# Patient Record
Sex: Male | Born: 1960
Health system: Southern US, Community
[De-identification: ages and names within clinical notes are randomized; demographics above are authoritative.]

## PROBLEM LIST (undated history)

## (undated) DIAGNOSIS — I428 Other cardiomyopathies: Secondary | ICD-10-CM

## (undated) DIAGNOSIS — R079 Chest pain, unspecified: Secondary | ICD-10-CM

## (undated) DIAGNOSIS — M109 Gout, unspecified: Secondary | ICD-10-CM

## (undated) DIAGNOSIS — I10 Essential (primary) hypertension: Secondary | ICD-10-CM

## (undated) DIAGNOSIS — I2510 Atherosclerotic heart disease of native coronary artery without angina pectoris: Secondary | ICD-10-CM

## (undated) HISTORY — PX: STOMACH SURGERY: SHX791

## (undated) HISTORY — DX: Chest pain, unspecified: R07.9

## (undated) HISTORY — DX: Essential (primary) hypertension: I10

## (undated) HISTORY — DX: Other cardiomyopathies: I42.8

## (undated) HISTORY — DX: Gout, unspecified: M10.9

---

## 1998-12-28 ENCOUNTER — Emergency Department (HOSPITAL_COMMUNITY): Admission: EM | Admit: 1998-12-28 | Discharge: 1998-12-28 | Payer: Self-pay | Admitting: *Deleted

## 2000-06-26 ENCOUNTER — Emergency Department (HOSPITAL_COMMUNITY): Admission: EM | Admit: 2000-06-26 | Discharge: 2000-06-26 | Payer: Self-pay

## 2000-07-01 ENCOUNTER — Emergency Department (HOSPITAL_COMMUNITY): Admission: EM | Admit: 2000-07-01 | Discharge: 2000-07-01 | Payer: Self-pay | Admitting: Emergency Medicine

## 2000-10-13 ENCOUNTER — Emergency Department (HOSPITAL_COMMUNITY): Admission: EM | Admit: 2000-10-13 | Discharge: 2000-10-13 | Payer: Self-pay | Admitting: Emergency Medicine

## 2000-12-26 ENCOUNTER — Emergency Department (HOSPITAL_COMMUNITY): Admission: EM | Admit: 2000-12-26 | Discharge: 2000-12-26 | Payer: Self-pay | Admitting: *Deleted

## 2001-08-09 ENCOUNTER — Encounter: Payer: Self-pay | Admitting: Emergency Medicine

## 2001-08-09 ENCOUNTER — Emergency Department (HOSPITAL_COMMUNITY): Admission: EM | Admit: 2001-08-09 | Discharge: 2001-08-09 | Payer: Self-pay | Admitting: Emergency Medicine

## 2002-03-22 ENCOUNTER — Encounter: Payer: Self-pay | Admitting: Nephrology

## 2002-03-22 ENCOUNTER — Encounter: Admission: RE | Admit: 2002-03-22 | Discharge: 2002-03-22 | Payer: Self-pay | Admitting: Nephrology

## 2002-12-14 ENCOUNTER — Emergency Department (HOSPITAL_COMMUNITY): Admission: EM | Admit: 2002-12-14 | Discharge: 2002-12-14 | Payer: Self-pay | Admitting: Emergency Medicine

## 2002-12-14 ENCOUNTER — Encounter: Payer: Self-pay | Admitting: Emergency Medicine

## 2004-10-08 ENCOUNTER — Emergency Department (HOSPITAL_COMMUNITY): Admission: EM | Admit: 2004-10-08 | Discharge: 2004-10-09 | Payer: Self-pay | Admitting: Emergency Medicine

## 2007-10-06 ENCOUNTER — Emergency Department (HOSPITAL_COMMUNITY): Admission: EM | Admit: 2007-10-06 | Discharge: 2007-10-06 | Payer: Self-pay | Admitting: Emergency Medicine

## 2011-03-12 LAB — CBC
Hemoglobin: 16.9
MCHC: 34.6
MCV: 89.6
RBC: 5.46
WBC: 11.6 — ABNORMAL HIGH

## 2011-03-12 LAB — POCT I-STAT, CHEM 8
BUN: 8
Chloride: 106
Creatinine, Ser: 1.1
Potassium: 3.6
Sodium: 142

## 2011-03-12 LAB — DIFFERENTIAL
Basophils Relative: 1
Eosinophils Absolute: 0
Eosinophils Relative: 0
Lymphocytes Relative: 15
Neutro Abs: 9.1 — ABNORMAL HIGH

## 2011-03-12 LAB — URINALYSIS, ROUTINE W REFLEX MICROSCOPIC
Glucose, UA: NEGATIVE
Hgb urine dipstick: NEGATIVE
Protein, ur: NEGATIVE
Specific Gravity, Urine: 1.006
pH: 7.5

## 2012-03-13 ENCOUNTER — Observation Stay (HOSPITAL_COMMUNITY): Payer: Self-pay

## 2012-03-13 ENCOUNTER — Encounter (HOSPITAL_COMMUNITY): Payer: Self-pay

## 2012-03-13 ENCOUNTER — Observation Stay (HOSPITAL_COMMUNITY)
Admission: EM | Admit: 2012-03-13 | Discharge: 2012-03-14 | Disposition: A | Discharge status: Home / Self Care | Payer: Self-pay | Attending: Emergency Medicine | Admitting: Emergency Medicine

## 2012-03-13 ENCOUNTER — Emergency Department (HOSPITAL_COMMUNITY): Payer: Self-pay

## 2012-03-13 DIAGNOSIS — R079 Chest pain, unspecified: Secondary | ICD-10-CM

## 2012-03-13 DIAGNOSIS — I10 Essential (primary) hypertension: Secondary | ICD-10-CM | POA: Insufficient documentation

## 2012-03-13 DIAGNOSIS — R0789 Other chest pain: Principal | ICD-10-CM | POA: Insufficient documentation

## 2012-03-13 LAB — CBC
HCT: 45.1 % (ref 39.0–52.0)
Hemoglobin: 15.6 g/dL (ref 13.0–17.0)
MCH: 30.2 pg (ref 26.0–34.0)
MCV: 87.2 fL (ref 78.0–100.0)
Platelets: 276 10*3/uL (ref 150–400)
RBC: 5.17 MIL/uL (ref 4.22–5.81)
WBC: 6.4 10*3/uL (ref 4.0–10.5)

## 2012-03-13 LAB — POCT I-STAT, CHEM 8
BUN: 13 mg/dL (ref 6–23)
Creatinine, Ser: 1.1 mg/dL (ref 0.50–1.35)
Glucose, Bld: 115 mg/dL — ABNORMAL HIGH (ref 70–99)
Hemoglobin: 16.7 g/dL (ref 13.0–17.0)
Potassium: 3.8 mEq/L (ref 3.5–5.1)

## 2012-03-13 LAB — APTT: aPTT: 30 seconds (ref 24–37)

## 2012-03-13 LAB — POCT I-STAT TROPONIN I
Troponin i, poc: 0 ng/mL (ref 0.00–0.08)
Troponin i, poc: 0 ng/mL (ref 0.00–0.08)

## 2012-03-13 MED ORDER — ASPIRIN 81 MG PO CHEW
324.0000 mg | CHEWABLE_TABLET | Freq: Once | ORAL | Status: AC
  Administered 2012-03-13: 324 mg via ORAL
  Filled 2012-03-13: qty 4

## 2012-03-13 MED ORDER — NITROGLYCERIN 0.4 MG SL SUBL
SUBLINGUAL_TABLET | SUBLINGUAL | Status: AC
  Filled 2012-03-13: qty 25

## 2012-03-13 MED ORDER — METOPROLOL TARTRATE 1 MG/ML IV SOLN
INTRAVENOUS | Status: AC
  Administered 2012-03-13: 5 mg via INTRAVENOUS
  Filled 2012-03-13: qty 15

## 2012-03-13 MED ORDER — METOPROLOL TARTRATE 25 MG PO TABS
50.0000 mg | ORAL_TABLET | Freq: Once | ORAL | Status: AC
  Administered 2012-03-13: 50 mg via ORAL
  Filled 2012-03-13: qty 2

## 2012-03-13 MED ORDER — METOPROLOL TARTRATE 1 MG/ML IV SOLN
5.0000 mg | Freq: Once | INTRAVENOUS | Status: AC
  Administered 2012-03-13: 5 mg via INTRAVENOUS

## 2012-03-13 MED ORDER — NITROGLYCERIN 2 % TD OINT
1.0000 [in_us] | TOPICAL_OINTMENT | Freq: Once | TRANSDERMAL | Status: AC
  Administered 2012-03-13: 1 [in_us] via TOPICAL
  Filled 2012-03-13: qty 1

## 2012-03-13 MED ORDER — METOPROLOL TARTRATE 25 MG PO TABS
50.0000 mg | ORAL_TABLET | Freq: Four times a day (QID) | ORAL | Status: DC
  Administered 2012-03-13 – 2012-03-14 (×3): 50 mg via ORAL
  Filled 2012-03-13: qty 2
  Filled 2012-03-13 (×2): qty 1
  Filled 2012-03-13: qty 2

## 2012-03-13 MED ORDER — METOPROLOL TARTRATE 25 MG PO TABS
100.0000 mg | ORAL_TABLET | Freq: Once | ORAL | Status: AC
  Administered 2012-03-13: 100 mg via ORAL
  Filled 2012-03-13: qty 4

## 2012-03-13 MED ORDER — SODIUM CHLORIDE 0.9 % IV SOLN
1000.0000 mL | INTRAVENOUS | Status: DC
  Administered 2012-03-13: 1000 mL via INTRAVENOUS

## 2012-03-13 NOTE — ED Notes (Signed)
bp down pt up to the br

## 2012-03-13 NOTE — ED Notes (Signed)
repor given to [employer], Charity fundraiser

## 2012-03-13 NOTE — ED Notes (Signed)
Pt waif echecked his BP this am and it was high

## 2012-03-13 NOTE — ED Provider Notes (Signed)
Discussed with the patient with Dr. Antoine Poche.  Recommends remaining on protocol overnight, consider 50 mg lopressor po q 6 hours.  As long as systolic pressure remains above 110, patient may receive up to 400 mg lopressor po.  Patient currently resting comfortably, remains chest pain free.  Does appear to be anxious.  Reports increased stress level due to demands of new job.  Lungs CTA bilaterally, S1/S2, RRR.  Abdomen soft, bowel sounds present.  Strong distal pulses palpated all extremities.  Monitor reveals NSR without ectopy.  Labs unremarkable.  12 lead reviewed, no indication of ischemia.  Negative troponins.  Discussed treatment plan with patient.  Patient has received information from Psychologist, sport and exercise.    Jimmye Norman, NP 03/13/12 2337

## 2012-03-13 NOTE — ED Notes (Signed)
Istat Troponin 0.00

## 2012-03-13 NOTE — ED Provider Notes (Addendum)
History     CSN: [id number] Arrival date & time 03/13/12  0903 First MD Initiated Contact with Patient 03/13/12 0915      Chief Complaint  Patient presents with  . Hypertension    HPI Pt has not seen a doctor for 4 years.  He was having some pain in his chest this am when he woke up.  He has had tightness in his left chest.  His heart was racing and he was short of breath.  He feels like the symptoms have resolved at this point.  Pt denies any fever or cough.  No leg swelling.  Family took his blood pressure today and it was noted to be elevated.  No past medical history on file.  No past surgical history on file.  No family history on file.  History  Substance Use Topics  . Smoking status: Former Games developer  . Smokeless tobacco: Not on file  . Alcohol Use: No      Review of Systems  All other systems reviewed and are negative.    Allergies  Review of patient's allergies indicates no known allergies.  Home Medications   Current Outpatient Rx  Name Route Sig Dispense Refill  . OMEGA-3 FATTY ACIDS 1000 MG PO CAPS Oral Take 1 g by mouth daily.    . ADULT MULTIVITAMIN W/MINERALS CH Oral Take 1 tablet by mouth daily.      BP 186/121  Pulse 103  Temp 98.2 F (36.8 C) (Oral)  Resp 17  SpO2 99%  Physical Exam  Nursing note and vitals reviewed. Constitutional: He appears well-developed and well-nourished. No distress.  HENT:  Head: Normocephalic and atraumatic.  Right Ear: External ear normal.  Left Ear: External ear normal.  Eyes: Conjunctivae normal are normal. Right eye exhibits no discharge. Left eye exhibits no discharge. No scleral icterus.  Neck: Neck supple. No tracheal deviation present.  Cardiovascular: Normal rate, regular rhythm and intact distal pulses.   Pulmonary/Chest: Effort normal and breath sounds normal. No stridor. No respiratory distress. He has no wheezes. He has no rales.  Abdominal: Soft. Bowel sounds are normal. He exhibits no distension.  There is no tenderness. There is no rebound and no guarding.  Musculoskeletal: He exhibits no edema and no tenderness.  Neurological: He is alert. He has normal strength. No sensory deficit. Cranial nerve deficit:  no gross defecits noted. He exhibits normal muscle tone. He displays no seizure activity. Coordination normal.  Skin: Skin is warm and dry. No rash noted.  Psychiatric: He has a normal mood and affect.    ED Course  Procedures (including critical care time) EKG Rate 100 SINUS TACHYCARDIA ~ V-rate> 99 PROBABLE LEFT ATRIAL ABNORMALITY ~ P >46mS, <-0.32mV V100 Nl interval and axis Labs Reviewed  POCT I-STAT, CHEM 8 - Abnormal; Notable for the following:    Glucose, Bld 115 (*)     All other components within normal limits  CBC  PROTIME-INR  APTT  POCT I-STAT TROPONIN I  POCT I-STAT TROPONIN I   Dg Chest Port 1 View  03/13/2012  *RADIOLOGY REPORT*  Clinical Data: Chest pain, hypertension.  PORTABLE CHEST - 1 VIEW  Comparison: None.  Findings: Heart is upper limits normal in size.  Mild peribronchial thickening and interstitial prominence.  No confluent opacities or effusions.  No acute bony abnormality.  IMPRESSION: Suspect mild bronchitic changes.   Original Report Authenticated By: Cyndie Chime, M.D.      1. Chest pain   2. Hypertension  MDM  The patient's i-STAT troponin and i-STAT Chem-8 are unremarkable. The patient has no history of heart disease however he does appear to have undiagnosed hypertension.  The patient has heart score of 4 moderately suspicious history, age, and risk factors.  Will move pt to CDU for further treatment.  Plan on CT for further evaluation.   Celene Kras, MD 03/13/12 1048 5 foot 11.  Weight 238.    BMI 33.2  Celene Kras, MD 03/13/12 1057  4:04 PM Pt was unable to get his chest CT.  Heart rate too fast although down in the 70s.  Pt can be done tomorrow.    Could not get a stress echo this afternoon considering his nitrate  use.   Will consult with cardiology but anticipate we will hold overnight and perform CT in the am.  Pt is comfortable with the plan.  No distress at this time.  Celene Kras, MD 03/13/12 [phone]

## 2012-03-13 NOTE — ED Notes (Signed)
Pt received 5 mg metoprolol IV @ 1515 and second dose (5 mg) at 1520.  Verbal order from Hulan Saas, MD

## 2012-03-13 NOTE — ED Notes (Signed)
i-stat troponin results=.00 

## 2012-03-13 NOTE — ED Notes (Signed)
BMI 33.7 

## 2012-03-13 NOTE — ED Notes (Signed)
i moved the pt to a hospital bed in 2 for comfort.  More relaxed now bp still high

## 2012-03-13 NOTE — ED Notes (Signed)
Na        141 K          3.8 Cl         102 ICa        1.23 TCO2    28  Glu        115 BUN      13 Creat     1.1 Hct        49 Hgb       16.7  Anion Gap 16

## 2012-03-13 NOTE — ED Notes (Signed)
Pt sleeping. 

## 2012-03-13 NOTE — ED Notes (Signed)
Pt very anxious. States that he stopped smoking 4 years ago has gained some wait and wife has been checking his BP lately

## 2012-03-14 ENCOUNTER — Observation Stay (HOSPITAL_COMMUNITY): Payer: Self-pay

## 2012-03-14 MED ORDER — NITROGLYCERIN 0.4 MG SL SUBL
SUBLINGUAL_TABLET | SUBLINGUAL | Status: AC
  Administered 2012-03-14: 11:00:00
  Filled 2012-03-14: qty 25

## 2012-03-14 MED ORDER — IOHEXOL 350 MG/ML SOLN: 80.0000 mL | Freq: Once | INTRAVENOUS | Status: DC | PRN

## 2012-03-14 MED ORDER — LORAZEPAM 2 MG/ML IJ SOLN
0.5000 mg | Freq: Once | INTRAMUSCULAR | Status: AC
  Administered 2012-03-14: 0.5 mg via INTRAVENOUS
  Filled 2012-03-14: qty 1

## 2012-03-14 MED ORDER — METOPROLOL TARTRATE 1 MG/ML IV SOLN
INTRAVENOUS | Status: AC
  Administered 2012-03-14: 11:00:00
  Filled 2012-03-14: qty 15

## 2012-03-14 NOTE — ED Provider Notes (Signed)
9:33 AM Patient is in CDU under observation, chest pain protocol.  This is a shared visit with Dr Roselyn Bering.  Patient reports he has been chest pain free since his arrival to ED.  No acute events overnight. Attempt was made to do coronary CT yesterday but patient's heart rate remained too high.  Dr Antoine Poche was consulted and plan made to continued beta blockers and attempt CT this morning.  Patient is A&Ox4, NAD, RRR, no m/r/g, chest nontender, lungs CTAB, abd soft, NT, extremities without edema, distal pulses intact. Patient's HR currently ~70. Plan for Coronary CT.    Received phone call from Dr Llana Aliment - pt does have nonobstructive coronary artery disease, calcium score is 55, 91st percentile for age and gender.  Given patient's high scores, I have paged [facility] to discuss results.    1:19 PM I spoke with Tereso Newcomer, [facility] cardiology regarding patient's results.  He also spoke with his attending and have reviewed results. Plan is for outpatient follow up with cardiology.  I have discussed this and all results with patient who verbalizes understanding and agrees with plan.  Pt has been given resources for PCP follow up - strongly advised to follow closely with PCP and have blood pressure rechecked within 1 week, also recommended checking and charting blood pressure prior to visit (pt has home cuff).  Pt advised of the risks of sustained hypertension.   Pt d/c home with PCP and cardiology f/u.   Discussed all results with patient.  Pt given return precautions.  Pt verbalizes understanding and agrees with plan.     Results for orders placed during the hospital encounter of 03/13/12  CBC      Component Value Range   WBC 6.4  4.0 - 10.5 K/uL   RBC 5.17  4.22 - 5.81 MIL/uL   Hemoglobin 15.6  13.0 - 17.0 g/dL   HCT 44.0  10.2 - 72.5 %   MCV 87.2  78.0 - 100.0 fL   MCH 30.2  26.0 - 34.0 pg   MCHC 34.6  30.0 - 36.0 g/dL   RDW 36.6  44.0 - 34.7 %   Platelets 276  150 - 400 K/uL    PROTIME-INR      Component Value Range   Prothrombin Time 12.9  11.6 - 15.2 seconds   INR 0.98  0.00 - 1.49  APTT      Component Value Range   aPTT 30  24 - 37 seconds  POCT I-STAT TROPONIN I      Component Value Range   Troponin i, poc 0.00  0.00 - 0.08 ng/mL   Comment 3           POCT I-STAT, CHEM 8      Component Value Range   Sodium 141  135 - 145 mEq/L   Potassium 3.8  3.5 - 5.1 mEq/L   Chloride 102  96 - 112 mEq/L   BUN 13  6 - 23 mg/dL   Creatinine, Ser 4.25  0.50 - 1.35 mg/dL   Glucose, Bld 956 (*) 70 - 99 mg/dL   Calcium, Ion 3.87  5.64 - 1.23 mmol/L   TCO2 28  0 - 100 mmol/L   Hemoglobin 16.7  13.0 - 17.0 g/dL   HCT 33.2  95.1 - 88.4 %  POCT I-STAT TROPONIN I      Component Value Range   Troponin i, poc 0.00  0.00 - 0.08 ng/mL   Comment 3  POCT I-STAT TROPONIN I      Component Value Range   Troponin i, poc 0.00  0.00 - 0.08 ng/mL   Comment 3            Ct Heart Morp W/cta Cor W/score W/ca W/cm &/or Wo/cm  03/14/2012  *RADIOLOGY REPORT*  INDICATION:  51 year old male presenting with chest pain.  CT ANGIOGRAPHY OF THE HEART, CORONARY ARTERY, STRUCTURE, AND MORPHOLOGY  CONTRAST:  80 ml of Omnipaque 350.  COMPARISON:  No priors.  TECHNIQUE:  CT angiography of the coronary vessels was performed on a 256 channel system using prospective ECG gating.  A scout and noncontrast exam (for calcium scoring) were performed.  Circulation time was measured using a test bolus.  Coronary CTA was performed with sub mm slice collimation during portions of the cardiac cycle after prior injection of iodinated contrast.  Imaging post processing was performed on an independent workstation creating multiplanar and 3-D images, and quantitative analysis of the heart and coronary arteries.  Note that this exam targets the heart and the chest was not imaged in its entirety.  PREMEDICATION: Lopressor 150 mg, P.O. Lopressor 5 mg, IV Nitroglycerin 400 mcg, sublingual.  FINDINGS: Technical  quality:  Excellent  Heart rate:  60  CORONARY ARTERIES: Left main coronary artery:  Negative. Left anterior descending:  Eccentric calcified plaque at the ostium, without any associated luminal stenosis. Otherwise, negative. Ramus intermedius:  Negative. Left circumflex:  Negative. Right coronary artery:  Negative. Posterior descending artery:  Negative. Dominance:  Codominant.  CORONARY CALCIUM: Total Agatston Score:  55 MESA database percentile:  91st  AORTA AND PULMONARY MEASUREMENTS: Aortic root (21 - 40 mm):             25 mm  at the annulus             39 mm  at the sinuses of Valsalva             31 mm  at the sinotubular junction Ascending aorta ( <  40 mm):  35 mm Descending aorta ( <  40 mm):  26 mm Main pulmonary artery:  ( <  30 mm):  27 mm  EXTRACARDIAC FINDINGS: Mild diffuse bronchial wall thickening.  Within the visualized portions of the thorax and there is no acute consolidative airspace disease, no pleural effusions and no definite suspicious-appearing pulmonary nodules or masses.  No pericardial fluid or thickening. Visualized portions of the upper abdomen are unremarkable.  IMPRESSION:  1.  Mild nonobstructive coronary artery disease.  The patient's total coronary artery calcium score is 55, which is 91st percentile for patient's matched age and gender.   Assessment for potential risk factor modification, dietary therapy or pharmacologic therapy may be warranted, if clinically indicated. 2.  Mild diffuse bronchial wall thickening.  This is nonspecific, and could suggest very mild bronchitis or reactive airway disease. No signs of bronchopneumonia within the visualized thorax are identified at this time. 3.  Codominance of the coronary arteries.  Report was called to CDU mid level at [phone] at 12 noon on 03/14/2012.   Original Report Authenticated By: Florencia Reasons, M.D.    Dg Chest Port 1 View  03/13/2012  *RADIOLOGY REPORT*  Clinical Data: Chest pain, hypertension.  PORTABLE CHEST - 1  VIEW  Comparison: None.  Findings: Heart is upper limits normal in size.  Mild peribronchial thickening and interstitial prominence.  No confluent opacities or effusions.  No acute bony abnormality.  IMPRESSION: Suspect mild bronchitic  changes.   Original Report Authenticated By: Cyndie Chime, M.D.       [place], [place] 03/14/12 [phone]

## 2012-03-14 NOTE — ED Notes (Signed)
Patient states he is unable to sleep. Offered to speak to physician concerning medication for sleep. Will continue to monitor patient

## 2012-03-14 NOTE — ED Notes (Signed)
Spoke with CT scan Avril, She spoke with Dr. Reche Dixon. Order received from Dr. Reche Dixon to five 50 mg PO Lopressor and to reevaluate in 1 hour.

## 2012-03-14 NOTE — ED Provider Notes (Signed)
Medical screening examination/treatment/procedure(s) were performed by non-physician practitioner and as supervising physician I was immediately available for consultation/collaboration.  Krithi Bray R. Yasmin Dibello, MD 03/14/12 2254 

## 2012-03-16 NOTE — ED Provider Notes (Signed)
Medical screening examination/treatment/procedure(s) were conducted as a shared visit with non-physician practitioner(s) and myself.  I personally evaluated the patient during the encounter   Celene Kras, MD 03/16/12 [phone]

## 2012-03-30 ENCOUNTER — Encounter: Payer: Self-pay | Admitting: *Deleted

## 2012-03-30 ENCOUNTER — Encounter: Payer: Self-pay | Admitting: Cardiovascular Disease

## 2012-03-30 DIAGNOSIS — R079 Chest pain, unspecified: Secondary | ICD-10-CM | POA: Insufficient documentation

## 2012-03-30 DIAGNOSIS — I10 Essential (primary) hypertension: Secondary | ICD-10-CM | POA: Insufficient documentation

## 2012-03-31 ENCOUNTER — Ambulatory Visit (INDEPENDENT_AMBULATORY_CARE_PROVIDER_SITE_OTHER): Payer: Self-pay | Admitting: Cardiovascular Disease

## 2012-03-31 ENCOUNTER — Encounter: Payer: Self-pay | Admitting: Cardiovascular Disease

## 2012-03-31 VITALS — BP 130/82 | HR 82 | Ht 71.0 in | Wt 239.0 lb

## 2012-03-31 DIAGNOSIS — I10 Essential (primary) hypertension: Secondary | ICD-10-CM

## 2012-03-31 DIAGNOSIS — R079 Chest pain, unspecified: Secondary | ICD-10-CM

## 2012-03-31 DIAGNOSIS — Z8639 Personal history of other endocrine, nutritional and metabolic disease: Secondary | ICD-10-CM

## 2012-03-31 DIAGNOSIS — Z862 Personal history of diseases of the blood and blood-forming organs and certain disorders involving the immune mechanism: Secondary | ICD-10-CM

## 2012-03-31 MED ORDER — LOSARTAN POTASSIUM 25 MG PO TABS: 25.0000 mg | ORAL_TABLET | Freq: Every day | ORAL | Status: DC

## 2012-03-31 NOTE — Assessment & Plan Note (Signed)
Nonrecurrent since ER visit  CT with nonobstructive disease.  Discussed weight loss, ASA, Rx BP.  Given calcium score under 100 no need for functional study

## 2012-03-31 NOTE — Assessment & Plan Note (Signed)
Start Cozaar and low sodium diet.  F/U 6 months

## 2012-03-31 NOTE — Patient Instructions (Addendum)
Your physician wants you to follow-up in:   6 MONTHS WITH DR Haywood Filler will receive a reminder letter in the mail two months in advance. If you don't receive a letter, please call our office to schedule the follow-up appointment. Your physician has recommended you make the following change in your medication: START LOSARTAN  25 MG EVERY DAY  Your physician recommends that you return for lab work in: FASTING THIS WEEK   BMET LIPID LIVER

## 2012-03-31 NOTE — Progress Notes (Signed)
Patient ID: Todd Meyer, male   DOB: 01/11/1961, 51 y.o.   MRN: [id number] 51 yo seen in [facility] ED 9/27 for chest pain.  R/O  Had some issues with getting HR low enough to do cardiac CT.  Reviewed scan on [employer] work station  IMPRESSION:  1. Mild nonobstructive coronary artery disease. The patient's total coronary artery calcium score is 55, which is 91st percentile for patient's matched age and gender. Assessment for potential risk factor modification, dietary therapy or pharmacologic therapy may be warranted, if clinically indicated. 2. Mild diffuse bronchial wall thickening. This is nonspecific, and could suggest very mild bronchitis or reactive airway disease. No signs of bronchopneumonia within the visualized thorax are identified at this time. 3. Codominance of the coronary arteries.  Essentially nonobstructive disease with high calcium score for age and sex mathed controls.  Reviewed home BP readings and somewhat high.  Not taking ASA Prevoiusly seen by Dr Audrie Lia but not now Needs fasting lipids.  Works driving a Presenter, broadcasting. No aerobic activity  Too much salt in diet and gained a lot of weight this year  ROS: Denies fever, malais, weight loss, blurry vision, decreased visual acuity, cough, sputum, SOB, hemoptysis, pleuritic pain, palpitaitons, heartburn, abdominal pain, melena, lower extremity edema, claudication, or rash.  All other systems reviewed and negative   General: Affect appropriate Healthy:  appears stated age HEENT: normal Neck supple with no adenopathy JVP normal no bruits no thyromegaly Lungs clear with no wheezing and good diaphragmatic motion Heart:  S1/S2 no murmur,rub, gallop or click PMI normal Abdomen: benighn, BS positve, no tenderness, no AAA no bruit.  No HSM or HJR Distal pulses intact with no bruits No edema Neuro non-focal Skin warm and dry No muscular weakness  Medications Current Outpatient Prescriptions  Medication Sig Dispense Refill  .  fish oil-omega-3 fatty acids 1000 MG capsule Take 1 g by mouth daily.      . Multiple Vitamin (MULTIVITAMIN WITH MINERALS) TABS Take 1 tablet by mouth daily.      Marland Kitchen losartan (COZAAR) 25 MG tablet Take 1 tablet (25 mg total) by mouth daily.  90 tablet  3    Allergies Review of patient's allergies indicates no known allergies.  Family History: Family History  Problem Relation Age of Onset  . Adopted: Yes    Social History: History   Social History  . Marital Status: Married    Spouse Name: N/A    Number of Children: N/A  . Years of Education: N/A   Occupational History  . Not on file.   Social History Main Topics  . Smoking status: Former Games developer  . Smokeless tobacco: Not on file  . Alcohol Use: No  . Drug Use: No  . Sexually Active:    Other Topics Concern  . Not on file   Social History Narrative  . No narrative on file    Electrocardiogram:  9/27  ST rate 100 otherise normal ECG  Assessment and Plan

## 2012-03-31 NOTE — Assessment & Plan Note (Signed)
No recent lipids in chart Given calcium score of 55 91st percentile for age will check fasting lipids with low threshold to start statin

## 2012-04-08 ENCOUNTER — Other Ambulatory Visit (INDEPENDENT_AMBULATORY_CARE_PROVIDER_SITE_OTHER): Payer: Self-pay

## 2012-04-08 DIAGNOSIS — R079 Chest pain, unspecified: Secondary | ICD-10-CM

## 2012-04-08 LAB — LIPID PANEL
HDL: 34.1 mg/dL — ABNORMAL LOW (ref >39.00)
Total CHOL/HDL Ratio: 6
VLDL: 25.4 mg/dL (ref 0.0–40.0)

## 2012-04-08 LAB — BASIC METABOLIC PANEL
BUN: 14 mg/dL (ref 6–23)
Chloride: 104 mEq/L (ref 96–112)
GFR: 105.04 mL/min (ref >60.00)
Potassium: 4.2 mEq/L (ref 3.5–5.1)
Sodium: 138 mEq/L (ref 135–145)

## 2012-04-08 LAB — HEPATIC FUNCTION PANEL
Alkaline Phosphatase: 67 U/L (ref 39–117)
Bilirubin, Direct: 0.1 mg/dL (ref 0.0–0.3)
Total Bilirubin: 0.7 mg/dL (ref 0.3–1.2)
Total Protein: 7.5 g/dL (ref 6.0–8.3)

## 2012-04-21 ENCOUNTER — Telehealth: Payer: Self-pay | Admitting: *Deleted

## 2012-04-21 DIAGNOSIS — Z79899 Other long term (current) drug therapy: Secondary | ICD-10-CM

## 2012-04-21 DIAGNOSIS — E785 Hyperlipidemia, unspecified: Secondary | ICD-10-CM

## 2012-04-21 MED ORDER — ATORVASTATIN CALCIUM 20 MG PO TABS: 20.0000 mg | ORAL_TABLET | Freq: Every day | ORAL | Status: DC

## 2012-04-21 NOTE — Telephone Encounter (Signed)
PT  AWARE OF LAB RESULTS WILL  TRY THE  LIPITOR  AND HAVE REPEAT  LABS IN 8 WEEKS  WANTS TO  SEE HOW HE  DOES WITH MEDS BEFORE  SEEING DIETICIAN

## 2012-06-22 ENCOUNTER — Other Ambulatory Visit: Payer: Self-pay

## 2012-11-06 ENCOUNTER — Ambulatory Visit (INDEPENDENT_AMBULATORY_CARE_PROVIDER_SITE_OTHER): Payer: Self-pay | Admitting: Cardiovascular Disease

## 2012-11-06 ENCOUNTER — Encounter: Payer: Self-pay | Admitting: Cardiovascular Disease

## 2012-11-06 VITALS — BP 142/88 | HR 90 | Ht 71.0 in | Wt 240.1 lb

## 2012-11-06 DIAGNOSIS — I10 Essential (primary) hypertension: Secondary | ICD-10-CM

## 2012-11-06 MED ORDER — LOSARTAN POTASSIUM 50 MG PO TABS: 50.0000 mg | ORAL_TABLET | Freq: Every day | ORAL | Status: DC

## 2012-11-06 NOTE — Assessment & Plan Note (Signed)
Resolved normal cardiac CT

## 2012-11-06 NOTE — Progress Notes (Signed)
Patient ID: SHIQUAN MATHIEU, male   DOB: July 20, 1960, 52 y.o.   MRN: [id number] 52 yo seen in [facility] ED 03/13/12 for chest pain. R/O Had some issues with getting HR low enough to do cardiac CT. Reviewed scan on [employer] work station  IMPRESSION:  1. Mild nonobstructive coronary artery disease. The patient's total coronary artery calcium score is 55, which is 91st percentile for patient's matched age and gender. Assessment for potential risk factor modification, dietary therapy or pharmacologic therapy may be warranted, if clinically indicated. 2. Mild diffuse bronchial wall thickening. This is nonspecific, and could suggest very mild bronchitis or reactive airway disease. No signs of bronchopneumonia within the visualized thorax are identified at this time. 3. Codominance of the coronary arteries.  Essentially nonobstructive disease with high calcium score for age and sex mathed controls. Reviewed home BP readings and somewhat high. Not taking ASA  Prevoiusly seen by Dr Audrie Lia but not now Needs fasting lipids. 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  Discussed low carb diet  ROS: Denies fever, malais, weight loss, blurry vision, decreased visual acuity, cough, sputum, SOB, hemoptysis, pleuritic pain, palpitaitons, heartburn, abdominal pain, melena, lower extremity edema, claudication, or rash.  All other systems reviewed and negative  General: Affect appropriate Healthy:  appears stated age HEENT: normal Neck supple with no adenopathy JVP normal no bruits no thyromegaly Lungs clear with no wheezing and good diaphragmatic motion Heart:  S1/S2 no murmur, no rub, gallop or click PMI normal Abdomen: benighn, BS positve, no tenderness, no AAA no bruit.  No HSM or HJR Distal pulses intact with no bruits No edema Neuro non-focal Skin warm and dry No muscular weakness   Current  Outpatient Prescriptions  Medication Sig Dispense Refill  . atorvastatin (LIPITOR) 20 MG tablet Take 1 tablet (20 mg total) by mouth daily.  90 tablet  3  . fish oil-omega-3 fatty acids 1000 MG capsule Take 1 g by mouth daily.      Marland Kitchen losartan (COZAAR) 25 MG tablet Take 1 tablet (25 mg total) by mouth daily.  90 tablet  3  . Multiple Vitamin (MULTIVITAMIN WITH MINERALS) TABS Take 1 tablet by mouth daily.       No current facility-administered medications for this visit.    Allergies  Review of patient's allergies indicates no known allergies.  Electrocardiogram:  NSR normal ECG  Assessment and Plan

## 2012-11-06 NOTE — Patient Instructions (Addendum)
Your physician recommends that you schedule a follow-up appointment in: SEE DR [facility]  IN [place]  Your physician has recommended you make the following change in your medication:  INCREASE LOSARTAN  TO 50 MG  EVERY DAY

## 2012-11-06 NOTE — Assessment & Plan Note (Signed)
Increase cozaar to 50 mg Wife to check home readings.  Weight loss F/U August

## 2013-01-29 ENCOUNTER — Encounter: Payer: Self-pay | Admitting: Cardiovascular Disease

## 2013-01-29 ENCOUNTER — Ambulatory Visit (INDEPENDENT_AMBULATORY_CARE_PROVIDER_SITE_OTHER): Payer: BC Managed Care – PPO | Admitting: Cardiovascular Disease

## 2013-01-29 VITALS — BP 128/94 | HR 83 | Wt 233.8 lb

## 2013-01-29 DIAGNOSIS — R079 Chest pain, unspecified: Secondary | ICD-10-CM

## 2013-01-29 DIAGNOSIS — Z0000 Encounter for general adult medical examination without abnormal findings: Secondary | ICD-10-CM

## 2013-01-29 DIAGNOSIS — Z8639 Personal history of other endocrine, nutritional and metabolic disease: Secondary | ICD-10-CM

## 2013-01-29 DIAGNOSIS — Z139 Encounter for screening, unspecified: Secondary | ICD-10-CM

## 2013-01-29 DIAGNOSIS — E785 Hyperlipidemia, unspecified: Secondary | ICD-10-CM

## 2013-01-29 DIAGNOSIS — Z862 Personal history of diseases of the blood and blood-forming organs and certain disorders involving the immune mechanism: Secondary | ICD-10-CM

## 2013-01-29 DIAGNOSIS — I10 Essential (primary) hypertension: Secondary | ICD-10-CM

## 2013-01-29 LAB — CBC WITH DIFFERENTIAL/PLATELET
Basophils Absolute: 0.1 10*3/uL (ref 0.0–0.1)
Eosinophils Absolute: 0.1 10*3/uL (ref 0.0–0.7)
Lymphocytes Relative: 45.8 % (ref 12.0–46.0)
Lymphs Abs: 3.1 10*3/uL (ref 0.7–4.0)
MCHC: 33.6 g/dL (ref 30.0–36.0)
Monocytes Relative: 9.6 % (ref 3.0–12.0)
Neutro Abs: 2.8 10*3/uL (ref 1.4–7.7)
Platelets: 271 10*3/uL (ref 150.0–400.0)
RDW: 14 % (ref 11.5–14.6)

## 2013-01-29 LAB — BASIC METABOLIC PANEL
CO2: 29 mEq/L (ref 19–32)
Calcium: 9.5 mg/dL (ref 8.4–10.5)
Chloride: 103 mEq/L (ref 96–112)
Creatinine, Ser: 1 mg/dL (ref 0.4–1.5)
Sodium: 135 mEq/L (ref 135–145)

## 2013-01-29 LAB — HEPATIC FUNCTION PANEL
ALT: 28 U/L (ref 0–53)
AST: 21 U/L (ref 0–37)
Albumin: 3.9 g/dL (ref 3.5–5.2)
Alkaline Phosphatase: 70 U/L (ref 39–117)
Total Protein: 7.1 g/dL (ref 6.0–8.3)

## 2013-01-29 LAB — LIPID PANEL
Cholesterol: 125 mg/dL (ref 0–200)
HDL: 35 mg/dL — ABNORMAL LOW (ref >39.00)
Total CHOL/HDL Ratio: 4
Triglycerides: 68 mg/dL (ref 0.0–149.0)

## 2013-01-29 LAB — HEMOGLOBIN A1C: Hgb A1c MFr Bld: 6.1 % (ref 4.6–6.5)

## 2013-01-29 NOTE — Assessment & Plan Note (Signed)
Well controlled.  Continue current medications and low sodium Dash type diet.    

## 2013-01-29 NOTE — Assessment & Plan Note (Signed)
Resolved No critical disease on CT  Continue risk factor modification for relatively high calcium score

## 2013-01-29 NOTE — Patient Instructions (Addendum)
Your physician wants you to follow-up in: YEAR WITH  DR Haywood Filler will receive a reminder letter in the mail two months in advance. If you don't receive a letter, please call our office to schedule the follow-up appointment. Your physician recommends that you continue on your current medications as directed. Please refer to the Current Medication list given to you today. Your physician recommends that you return for lab work in: TODAY  BMET  CBC  HGBA1C LIPID LIVER  PSA

## 2013-01-29 NOTE — Progress Notes (Signed)
Patient ID: Todd Meyer, male   DOB: 01-23-1961, 52 y.o.   MRN: [id number] 52 yo seen in [facility] ED 03/13/12 for chest pain. R/O Had some issues with getting HR low enough to do cardiac CT. Reviewed scan on [employer] work station  IMPRESSION:  1. Mild nonobstructive coronary artery disease. The patient's total coronary artery calcium score is 55, which is 91st percentile for patient's matched age and gender. Assessment for potential risk factor modification, dietary therapy or pharmacologic therapy may be warranted, if clinically indicated. 2. Mild diffuse bronchial wall thickening. This is nonspecific, and could suggest very mild bronchitis or reactive airway disease. No signs of bronchopneumonia within the visualized thorax are identified at this time. 3. Codominance of the coronary arteries.  Essentially nonobstructive disease with high calcium score for age and sex mathed controls.  Doing much better with diet and exercise Lost about 10 lbs and working out at [employer]  ROS: Denies fever, malais, weight loss, blurry vision, decreased visual acuity, cough, sputum, SOB, hemoptysis, pleuritic pain, palpitaitons, heartburn, abdominal pain, melena, lower extremity edema, claudication, or rash.  All other systems reviewed and negative  General: Affect appropriate Healthy:  appears stated age HEENT: normal Neck supple with no adenopathy JVP normal no bruits no thyromegaly Lungs clear with no wheezing and good diaphragmatic motion Heart:  S1/S2 no murmur, no rub, gallop or click PMI normal Abdomen: benighn, BS positve, no tenderness, no AAA no bruit.  No HSM or HJR Distal pulses intact with no bruits No edema Neuro non-focal Skin warm and dry No muscular weakness   Current Outpatient Prescriptions  Medication Sig Dispense Refill  . atorvastatin (LIPITOR) 20 MG tablet Take 1 tablet (20 mg total) by mouth daily.  90 tablet  3  . fexofenadine (ALLEGRA) 30 MG tablet Take 30 mg by  mouth as needed.      . fish oil-omega-3 fatty acids 1000 MG capsule Take 1 g by mouth daily.      Marland Kitchen losartan (COZAAR) 50 MG tablet Take 1 tablet (50 mg total) by mouth daily.  90 tablet  3  . Multiple Vitamin (MULTIVITAMIN WITH MINERALS) TABS Take 1 tablet by mouth daily.       No current facility-administered medications for this visit.    Allergies  Review of patient's allergies indicates no known allergies.  Electrocardiogram:  NSR rate 82 normal   Assessment and Plan

## 2013-01-29 NOTE — Assessment & Plan Note (Signed)
Has not established with primary Leretha Dykes yet  Will check chol and other labs

## 2013-04-22 ENCOUNTER — Other Ambulatory Visit: Payer: Self-pay

## 2013-04-27 NOTE — Telephone Encounter (Signed)
PT  HAD  LABS  DONE IN  [place] .Todd Meyer

## 2013-05-21 ENCOUNTER — Other Ambulatory Visit: Payer: Self-pay

## 2013-05-21 MED ORDER — ATORVASTATIN CALCIUM 20 MG PO TABS: 20.0000 mg | ORAL_TABLET | Freq: Every day | ORAL | Status: DC

## 2013-12-13 ENCOUNTER — Other Ambulatory Visit: Payer: Self-pay

## 2013-12-13 MED ORDER — LOSARTAN POTASSIUM 50 MG PO TABS: 50.0000 mg | ORAL_TABLET | Freq: Every day | ORAL | Status: DC

## 2014-05-15 ENCOUNTER — Other Ambulatory Visit: Payer: Self-pay | Admitting: Cardiovascular Disease

## 2014-07-20 ENCOUNTER — Other Ambulatory Visit: Payer: Self-pay | Admitting: Cardiovascular Disease

## 2014-11-17 ENCOUNTER — Other Ambulatory Visit: Payer: Self-pay | Admitting: Cardiovascular Disease

## 2014-11-18 NOTE — Telephone Encounter (Signed)
Per note 8.15.14

## 2014-12-03 ENCOUNTER — Other Ambulatory Visit: Payer: Self-pay | Admitting: Cardiovascular Disease

## 2014-12-05 NOTE — Telephone Encounter (Signed)
Needs lab work lipid and liver before refilling and F/U appt

## 2015-01-12 ENCOUNTER — Other Ambulatory Visit: Payer: Self-pay | Admitting: Cardiovascular Disease

## 2015-01-12 MED ORDER — LOSARTAN POTASSIUM 50 MG PO TABS
50.0000 mg | ORAL_TABLET | Freq: Every day | ORAL | Status: DC
Start: 2015-01-12 — End: 2016-04-26

## 2015-03-10 ENCOUNTER — Other Ambulatory Visit: Payer: Self-pay | Admitting: Cardiology

## 2015-03-10 NOTE — Telephone Encounter (Signed)
Patient has not been seen in over two years and a note to call and schedule has been put on his recent rx's, but he still has failed to do so. Please advise. Thanks, MI

## 2015-12-21 ENCOUNTER — Other Ambulatory Visit: Payer: Self-pay | Admitting: Cardiovascular Disease

## 2016-04-26 ENCOUNTER — Encounter (HOSPITAL_COMMUNITY): Payer: Self-pay | Admitting: Emergency Medicine

## 2016-04-26 ENCOUNTER — Emergency Department (HOSPITAL_COMMUNITY): Payer: BLUE CROSS/BLUE SHIELD

## 2016-04-26 ENCOUNTER — Emergency Department (HOSPITAL_COMMUNITY)
Admission: EM | Admit: 2016-04-26 | Discharge: 2016-04-26 | Disposition: A | Discharge status: Home / Self Care | Payer: BLUE CROSS/BLUE SHIELD | Attending: Emergency Medicine | Admitting: Emergency Medicine

## 2016-04-26 DIAGNOSIS — I2510 Atherosclerotic heart disease of native coronary artery without angina pectoris: Secondary | ICD-10-CM | POA: Insufficient documentation

## 2016-04-26 DIAGNOSIS — Z79899 Other long term (current) drug therapy: Secondary | ICD-10-CM | POA: Diagnosis not present

## 2016-04-26 DIAGNOSIS — M79674 Pain in right toe(s): Secondary | ICD-10-CM

## 2016-04-26 DIAGNOSIS — I10 Essential (primary) hypertension: Secondary | ICD-10-CM | POA: Insufficient documentation

## 2016-04-26 DIAGNOSIS — Z87891 Personal history of nicotine dependence: Secondary | ICD-10-CM | POA: Diagnosis not present

## 2016-04-26 DIAGNOSIS — M19071 Primary osteoarthritis, right ankle and foot: Secondary | ICD-10-CM | POA: Diagnosis not present

## 2016-04-26 DIAGNOSIS — M19079 Primary osteoarthritis, unspecified ankle and foot: Secondary | ICD-10-CM

## 2016-04-26 HISTORY — DX: Atherosclerotic heart disease of native coronary artery without angina pectoris: I25.10

## 2016-04-26 MED ORDER — INDOMETHACIN 25 MG PO CAPS: 25.0000 mg | ORAL_CAPSULE | Freq: Two times a day (BID) | ORAL | 0 refills | Status: DC

## 2016-04-26 MED ORDER — LOSARTAN POTASSIUM 25 MG PO TABS: 25.0000 mg | ORAL_TABLET | Freq: Every day | ORAL | 0 refills | Status: DC

## 2016-04-26 NOTE — ED Provider Notes (Signed)
[facility]-EMERGENCY DEPT Provider Note   CSN: [id number] Arrival date & time: 04/26/16  1027  By signing my name below, I, Placido Sou, attest that this documentation has been prepared under the direction and in the presence of [employer], PA-C.  Electronically Signed: Placido Sou, ED Scribe. 04/26/16. 11:22 AM.   History   Chief Complaint Chief Complaint  Patient presents with  . Toe Pain    HPI HPI Comments: Todd Meyer is a 55 y.o. male who presents to the Emergency Department complaining of intermittent, moderate, chronic, right great toe pain x 1.5 years. He states he initially was jumping off a delivery truck and injured the affected toe and has experienced his intermittent pain since. His pain worsens with ambulation and when palpating the region. Pt recently began a new job and stands for long periods of time. His pain slightly alleviates when elevating his foot. He reports associated redness and swelling in the affected region. He has not been evaluated for his PCP for the past 1.5 years and denies taking medication for HTN (174/111 mmHg in triage and used to take losartan 25mg ). He has never been dx with gout. He denies fevers, chills, CP, SOB, abdominal pain, nausea, vomiting or any other regions of pain.   The history is provided by the patient. No language interpreter was used.    Past Medical History:  Diagnosis Date  . Chest pain   . Coronary artery disease   . Hypertension     Patient Active Problem List   Diagnosis Date Noted  . H/O elevated lipids 03/31/2012  . Chest pain   . Hypertension     Past Surgical History:  Procedure Laterality Date  . STOMACH SURGERY     Fatty tissue removed       Home Medications    Prior to Admission medications   Medication Sig Start Date End Date Taking? Authorizing Provider  atorvastatin (LIPITOR) 20 MG tablet TAKE 1 TABLET BY MOUTH EVERY DAY 12/05/14   Wendall Stade, MD  fexofenadine (ALLEGRA) 30 MG  tablet Take 30 mg by mouth as needed.    Historical Provider, MD  fish oil-omega-3 fatty acids 1000 MG capsule Take 1 g by mouth daily.    Historical Provider, MD  indomethacin (INDOCIN) 25 MG capsule Take 1 capsule (25 mg total) by mouth 2 (two) times daily with a meal. 04/26/16   Waylan Boga Shiquan Mathieu, PA-C  losartan (COZAAR) 25 MG tablet Take 1 tablet (25 mg total) by mouth daily. 04/26/16   Emi Holes, PA-C  Multiple Vitamin (MULTIVITAMIN WITH MINERALS) TABS Take 1 tablet by mouth daily.    Historical Provider, MD    Family History Family History  Problem Relation Age of Onset  . Adopted: Yes    Social History Social History  Substance Use Topics  . Smoking status: Former Games developer  . Smokeless tobacco: Never Used  . Alcohol use No     Allergies   Patient has no known allergies.   Review of Systems Review of Systems  Constitutional: Negative for chills and fever.  Respiratory: Negative for shortness of breath.   Cardiovascular: Negative for chest pain.  Gastrointestinal: Negative for abdominal pain, nausea and vomiting.  Musculoskeletal: Positive for arthralgias and joint swelling.  Skin: Positive for color change. Negative for wound.  Neurological: Negative for numbness.   Physical Exam Updated Vital Signs BP (!) 174/111 (BP Location: Left Arm)   Pulse 102   Temp 98.3 F (36.8 C) (Oral)  Resp 16   SpO2 98%   Physical Exam  Constitutional: He appears well-developed and well-nourished. No distress.  HENT:  Head: Normocephalic and atraumatic.  Mouth/Throat: Oropharynx is clear and moist. No oropharyngeal exudate.  Eyes: Conjunctivae are normal. Pupils are equal, round, and reactive to light. Right eye exhibits no discharge. Left eye exhibits no discharge. No scleral icterus.  Neck: Normal range of motion. Neck supple. No thyromegaly present.  Cardiovascular: Normal rate, regular rhythm, normal heart sounds and intact distal pulses.  Exam reveals no gallop and no  friction rub.   No murmur heard. Pulmonary/Chest: Effort normal and breath sounds normal. No stridor. No respiratory distress. He has no wheezes. He has no rales.  Abdominal: Soft. Bowel sounds are normal. He exhibits no distension. There is no tenderness. There is no rebound and no guarding.  Musculoskeletal: He exhibits no edema.  Tenderness to MTP joint of right great toe; mild erythema; mild erythema where the foot would rub against the shoe; no warmth noted to the joint; normal sensation; pulses intact <2secs  Lymphadenopathy:    He has no cervical adenopathy.  Neurological: He is alert. Coordination normal.  Skin: Skin is warm and dry. No rash noted. He is not diaphoretic. No pallor.  Psychiatric: He has a normal mood and affect.  Nursing note and vitals reviewed.  ED Treatments / Results  Labs (all labs ordered are listed, but only abnormal results are displayed) Labs Reviewed - No data to display  EKG  EKG Interpretation None       Radiology Dg Toe Great Right  Result Date: 04/26/2016 CLINICAL DATA:  Pain for 1 year.  Redness. EXAM: RIGHT GREAT TOE COMPARISON:  None. FINDINGS: No fracture, dislocation, or bony erosion. Degenerative changes are seen at the first MTP joint and the first interphalangeal joint. IMPRESSION: Degenerative changes at the first MTP and interphalangeal joints. No fractures. No bony erosion. Electronically Signed   By: Gerome Sam III M.D   On: 04/26/2016 11:50    Procedures Procedures  COORDINATION OF CARE: 11:19 AM Discussed next steps with pt. Pt verbalized understanding and is agreeable with the plan.    Medications Ordered in ED Medications - No data to display   Initial Impression / Assessment and Plan / ED Course  I have reviewed the triage vital signs and the nursing notes.  Pertinent labs & imaging results that were available during my care of the patient were reviewed by me and considered in my medical decision making (see chart  for details).  Clinical Course     X-ray of right great toe shows degenerative changes at the first MTP and interphalangeal joint; no fractures or bony erosion. Doubt septic joint. No warmth and only mild erythema where foot would rub the shoe. No pain to light touch, so doubt gout. However, I will treat with indomethacin to cover arthritis as well as gout. Patient advised to use ice and elevation. Patient's blood pressure medication refilled. Patient advised to follow-up with his primary care provider. Patient also given follow-up to podiatrist. Return precautions discussed. Patient understands and agrees with plan. Patient discharged in satisfactory condition.   I personally performed the services described in this documentation, which was scribed in my presence. The recorded information has been reviewed and is accurate.  Final Clinical Impressions(s) / ED Diagnoses   Final diagnoses:  Pain of right great toe  Hypertension, unspecified type  Arthritis of great toe at metatarsophalangeal joint    New Prescriptions New Prescriptions  INDOMETHACIN (INDOCIN) 25 MG CAPSULE    Take 1 capsule (25 mg total) by mouth 2 (two) times daily with a meal.     Emi Holeslexandra M Izaiah Tabb, PA-C 04/26/16 1211    Laurence Spatesachel Morgan Little, MD 04/27/16 [id number]

## 2016-04-26 NOTE — ED Notes (Signed)
Patient transported to X-ray 

## 2016-04-26 NOTE — ED Notes (Signed)
Returned from xray

## 2016-04-26 NOTE — ED Triage Notes (Addendum)
C/O RIGHT GREAT TOE PAIN X 1.5 YEARS AFTER JUMPING AND INJURING TOE. 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 BASE OF TOE APPEARS RED.  PT STOPPED TAKING BP MEDS.

## 2016-04-26 NOTE — Discharge Instructions (Signed)
Medications: Indomethacin, losartan  Treatment: Take indomethacin twice daily as prescribed with a meal. Use ice 3-4 times daily alternating 20 minutes on, 20 minutes off. Elevate your leg when you are not walking on it. Resume taking losartan as before.  Follow-up: Please follow-up with Dr. Eden Emms within the next week. Please return to emergency department if you develop any new or worsening symptoms.

## 2016-05-23 ENCOUNTER — Encounter: Payer: Self-pay | Admitting: Internal Medicine

## 2016-05-27 ENCOUNTER — Encounter: Payer: Self-pay | Admitting: Internal Medicine

## 2016-05-27 ENCOUNTER — Encounter: Payer: Self-pay | Admitting: *Deleted

## 2016-05-27 ENCOUNTER — Ambulatory Visit (INDEPENDENT_AMBULATORY_CARE_PROVIDER_SITE_OTHER): Payer: BLUE CROSS/BLUE SHIELD | Admitting: Internal Medicine

## 2016-05-27 VITALS — BP 164/112 | HR 88 | Ht 72.0 in | Wt 283.8 lb

## 2016-05-27 DIAGNOSIS — I10 Essential (primary) hypertension: Secondary | ICD-10-CM

## 2016-05-27 DIAGNOSIS — R9431 Abnormal electrocardiogram [ECG] [EKG]: Secondary | ICD-10-CM | POA: Diagnosis not present

## 2016-05-27 DIAGNOSIS — I2510 Atherosclerotic heart disease of native coronary artery without angina pectoris: Secondary | ICD-10-CM | POA: Diagnosis not present

## 2016-05-27 DIAGNOSIS — M1A0710 Idiopathic chronic gout, right ankle and foot, without tophus (tophi): Secondary | ICD-10-CM

## 2016-05-27 LAB — COMPREHENSIVE METABOLIC PANEL
ALK PHOS: 80 U/L (ref 40–115)
ALT: 25 U/L (ref 9–46)
AST: 17 U/L (ref 10–35)
Albumin: 4.2 g/dL (ref 3.6–5.1)
BUN: 11 mg/dL (ref 7–25)
CALCIUM: 9.4 mg/dL (ref 8.6–10.3)
CO2: 28 mmol/L (ref 20–31)
Chloride: 104 mmol/L (ref 98–110)
Creat: 0.89 mg/dL (ref 0.70–1.33)
GLUCOSE: 82 mg/dL (ref 65–99)
POTASSIUM: 4.2 mmol/L (ref 3.5–5.3)
Sodium: 141 mmol/L (ref 135–146)
Total Bilirubin: 0.4 mg/dL (ref 0.2–1.2)
Total Protein: 7.3 g/dL (ref 6.1–8.1)

## 2016-05-27 LAB — LIPID PANEL
CHOL/HDL RATIO: 4.5 ratio (ref <5.0)
Cholesterol: 187 mg/dL (ref <200)
HDL: 42 mg/dL (ref >40)
LDL CALC: 123 mg/dL — AB (ref <100)
TRIGLYCERIDES: 108 mg/dL (ref <150)
VLDL: 22 mg/dL (ref <30)

## 2016-05-27 LAB — HEMOGLOBIN A1C
Hgb A1c MFr Bld: 5.9 % — ABNORMAL HIGH (ref <5.7)
Mean Plasma Glucose: 123 mg/dL

## 2016-05-27 MED ORDER — LOSARTAN POTASSIUM 50 MG PO TABS: 50.0000 mg | ORAL_TABLET | Freq: Every day | ORAL | 1 refills | Status: DC

## 2016-05-27 MED ORDER — COLCHICINE 0.6 MG PO TABS: 0.6000 mg | ORAL_TABLET | Freq: Every day | ORAL | 1 refills | Status: DC

## 2016-05-27 NOTE — Progress Notes (Signed)
New Outpatient Visit Date: 05/27/2016  Referring Provider: Kathrine Haddock, MD [facility]  Chief Complaint: High blood pressure  HPI:  Todd Meyer is a 55 y.o. year-old male with history of nonobstructive coronary artery disease, hypertension, and recently diagnosed gout, who has been referred by Dr. Alto Denver for management of uncontrolled hypertension. 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 At that time, his blood pressure was elevated to 183/118, prompting referral her clinic. The patient had previously seen Dr. Eden Emms as recently as 01/2013. The patient was evaluated in the emergency department last month due to pain involving the right great toe. He was diagnosed with gout and started on indomethacin. At that time, he was also noted to be quite hypertensive, prompting initiation of losartan 25 mg daily.   Today, the patient reports that he continues to have pain involving the right great toe. It improved somewhat while taking indomethacin, though his prescription has since run out. He does not check his blood pressures regularly at home. He has been taking losartan 25 mg daily, but is concerned that this is not strong enough as he was previously on higher doses. He was also on a diuretic in the past. The patient also questions the utility of a sleep study. He notes frequent snoring and has been found to have periods of brief apnea by his grandson and wife. He denies orthopnea but has occasional PND as well as exertional dyspnea.  The patient denies chest pain, palpitations and lightheadedness. He has occasional lower extremity edema. He is no longer taking atorvastatin, which is previously prescribed by Dr. Eden Emms, due to myalgias.  --------------------------------------------------------------------------------------------------  Cardiovascular History & Procedures: Cardiovascular Problems:  Nonobstructive  coronary artery disease  Uncontrolled hypertension  Risk Factors:  Known coronary artery disease, hypertension, impaired glucose tolerance, obesity, sedentary lifestyle, and male gender  Cath/PCI:  None  CV Surgery:  None  EP Procedures and Devices:  None  Non-Invasive Evaluation(s):  Coronary CTA (03/14/12): LMCA normal. LAD with eccentric calcified plaque at the ostium without any associated luminal irregularities. No significant disease involving the ramus LCx, or RCA. Calcium score 55 Agatston units. Aortic root upper normal limits of size.  Recent CV Pertinent Labs: Lab Results  Component Value Date   CHOL 125 01/29/2013   HDL 35.00 (L) 01/29/2013   LDLCALC 76 01/29/2013   LDLDIRECT 156.7 04/08/2012   TRIG 68.0 01/29/2013   CHOLHDL 4 01/29/2013   INR 0.98 03/13/2012   K 4.2 01/29/2013   BUN 17 01/29/2013   CREATININE 1.0 01/29/2013    --------------------------------------------------------------------------------------------------  Past Medical History:  Diagnosis Date  . Chest pain   . Coronary artery disease   . Hypertension     Past Surgical History:  Procedure Laterality Date  . STOMACH SURGERY     Fatty tissue removed    Outpatient Encounter Prescriptions as of 05/27/2016  Medication Sig  . losartan (COZAAR) 25 MG tablet Take 1 tablet (25 mg total) by mouth daily.  . Multiple Vitamin (MULTIVITAMIN WITH MINERALS) TABS Take 1 tablet by mouth daily.  . [DISCONTINUED] atorvastatin (LIPITOR) 20 MG tablet TAKE 1 TABLET BY MOUTH EVERY DAY  . [DISCONTINUED] fexofenadine (ALLEGRA) 30 MG tablet Take 30 mg by mouth as needed.  . [DISCONTINUED] fish oil-omega-3 fatty acids 1000 MG capsule Take 1 g by mouth daily.  . [DISCONTINUED] indomethacin (INDOCIN) 25 MG capsule Take 1 capsule (25 mg total) by mouth  2 (two) times daily with a meal.   No facility-administered encounter medications on file as of 05/27/2016.     Allergies: Patient has no known  allergies.  Social History   Social History  . Marital status: Married    Spouse name: N/A  . Number of children: N/A  . Years of education: N/A   Occupational History  . Not on file.   Social History Main Topics  . Smoking status: Former Games developermoker  . Smokeless tobacco: Never Used  . Alcohol use No  . Drug use: No  . Sexual activity: Not on file   Other Topics Concern  . Not on file   Social History Narrative  . No narrative on file    Family History  Problem Relation Age of Onset  . Adopted: Yes    Review of Systems: A 12-system review of systems was performed and was negative except as noted in the HPI.  --------------------------------------------------------------------------------------------------  Physical Exam: BP (!) 164/112 (BP Location: Right Arm)   Pulse 88   Ht 6' (1.829 m)   Wt 283 lb 12.8 oz (128.7 kg)   BMI 38.49 kg/m   General:  Morbidly obese man, seated comfortably in the exam room. He is accompanied by his grandson. HEENT: No conjunctival pallor or scleral icterus.  Moist mucous membranes.  OP clear. Neck: Supple without lymphadenopathy, thyromegaly, JVD, or HJR.  No carotid bruit. Lungs: Normal work of breathing.  Clear to auscultation bilaterally without wheezes or crackles. Heart: Regular rate and rhythm without murmurs, rubs, or gallops.  Non-displaced PMI. Abd: Bowel sounds present.  Soft, NT/ND without hepatosplenomegaly Ext: Trace pretibial edema bilaterally.  Radial, PT, and DP pulses are 2+ bilaterally in the PIP joint of the right great toe is slightly discolored and swollen with mild tenderness to palpation. Skin: warm and dry without rash Neuro: CNIII-XII intact.  Strength and fine-touch sensation intact in upper and lower extremities bilaterally. Psych: Normal mood and affect.  EKG:  Normal sinus rhythm with right bundle branch block. Compared with prior tracing from 01/29/13, right bundle branch block is new (I have personally  reviewed both tracings).  Lab Results  Component Value Date   WBC 6.7 01/29/2013   HGB 15.0 01/29/2013   HCT 44.5 01/29/2013   MCV 89.3 01/29/2013   PLT 271.0 01/29/2013    Lab Results  Component Value Date   NA 135 01/29/2013   K 4.2 01/29/2013   CL 103 01/29/2013   CO2 29 01/29/2013   BUN 17 01/29/2013   CREATININE 1.0 01/29/2013   GLUCOSE 114 (H) 01/29/2013   ALT 28 01/29/2013    Lab Results  Component Value Date   CHOL 125 01/29/2013   HDL 35.00 (L) 01/29/2013   LDLCALC 76 01/29/2013   LDLDIRECT 156.7 04/08/2012   TRIG 68.0 01/29/2013   CHOLHDL 4 01/29/2013    --------------------------------------------------------------------------------------------------  ASSESSMENT AND PLAN: Hypertension: Blood pressure remains uncontrolled today. The patient is only on losartan 25 mg daily. We will double this to 50 mg daily, though Todd Meyer will likely need additional agents to achieve target blood pressure. I would favor adding thiazide diuretic, though in the setting of his continuing gout flare, we will defer this. I will check a complete metabolic panel today as well as a basic metabolic panel in about 2 weeks to monitor his renal function and electrolytes with the escalation of losartan. The patient was advised to monitor his blood pressures at home and to contact us if it  is consistently above 140/90. We will obtain a transthoracic echocardiogram due to likely hypertensive heart disease as evidenced by his EKG demonstrating new right bundle branch block.  Abnormal EKG: EKG today demonstrates right bundle branch block, which is new since 2014. The patient does not have symptoms of ischemia. We will obtain an echocardiogram to evaluate for structural abnormalities.  Coronary artery disease: Nonobstructive CAD noted on coronary CTA in 2014. The patient is without symptoms suggest worsening coronary insufficiency. I have asked him to begin taking low-dose aspirin. We will also  further risk stratify him today with a fasting lipid panel and hemoglobin A1c. Based on these results, we will discuss reinitiation of statin therapy; I favor trying low-dose rosuvastatin given his history of myalgias with atorvastatin.  Gout: Patient is pain involving the right great toe. There is slight discoloration, joint swelling, and tenderness involving the PIP joint of the right great toe. We have agreed to start colchicine 0.6 mg daily. Referral to primary care provider was made for further management of the patient's noncardiac conditions, gout.  Follow-up: Return to clinic in 6 weeks.  Yvonne Kendall, MD 05/27/2016 11:50 AM

## 2016-05-27 NOTE — Patient Instructions (Signed)
Medication Instructions:  Increase losartan to 50mg  daily.  Start colchicine 0.6mg  daily.  Start aspirin 81mg  daily.  Labwork: Lipid profile/CMET/HGB A1c today   Testing/Procedures: Your physician has requested that you have an echocardiogram. Echocardiography is a painless test that uses sound waves to create images of your heart. It provides your doctor with information about the size and shape of your heart and how well your heart's chambers and valves are working. This procedure takes approximately one hour. There are no restrictions for this procedure.    Follow-Up: Your physician recommends that you schedule a follow-up appointment in: 6 weeks with Dr End.       If you need a refill on your cardiac medications before your next appointment, please call your pharmacy.

## 2016-05-31 ENCOUNTER — Other Ambulatory Visit: Payer: Self-pay | Admitting: *Deleted

## 2016-05-31 DIAGNOSIS — I10 Essential (primary) hypertension: Secondary | ICD-10-CM

## 2016-05-31 NOTE — Progress Notes (Signed)
error 

## 2016-06-18 ENCOUNTER — Ambulatory Visit (INDEPENDENT_AMBULATORY_CARE_PROVIDER_SITE_OTHER): Payer: BLUE CROSS/BLUE SHIELD | Admitting: Pharmacist

## 2016-06-18 ENCOUNTER — Encounter: Payer: Self-pay | Admitting: Pharmacist

## 2016-06-18 ENCOUNTER — Other Ambulatory Visit: Payer: BLUE CROSS/BLUE SHIELD | Admitting: *Deleted

## 2016-06-18 ENCOUNTER — Ambulatory Visit (HOSPITAL_COMMUNITY): Payer: BLUE CROSS/BLUE SHIELD | Attending: Cardiovascular Disease

## 2016-06-18 ENCOUNTER — Other Ambulatory Visit: Payer: Self-pay

## 2016-06-18 VITALS — BP 152/82 | HR 87 | Ht 72.0 in | Wt 273.5 lb

## 2016-06-18 DIAGNOSIS — I2510 Atherosclerotic heart disease of native coronary artery without angina pectoris: Secondary | ICD-10-CM | POA: Insufficient documentation

## 2016-06-18 DIAGNOSIS — I10 Essential (primary) hypertension: Secondary | ICD-10-CM

## 2016-06-18 DIAGNOSIS — R079 Chest pain, unspecified: Secondary | ICD-10-CM | POA: Diagnosis not present

## 2016-06-18 LAB — ECHOCARDIOGRAM COMPLETE

## 2016-06-18 NOTE — Progress Notes (Signed)
Patient ID: Todd Meyer                 DOB: [birth year]                      MRN: [id number]     HPI: Todd Meyer is a 56 y.o. male patient of Dr. Okey Dupre with PMH below who presents today for hypertension evaluation. Who was recently seen by Dr. Okey Dupre after elevation of blood pressure in the ER for a gout flare. At that time his losartan was increased to 50mg  daily. Dr. Okey Dupre would prefer thiazide diuretic, but this was deferred in the setting of gout flare.   He presents today stating he has been doing well since losartan increase. He states he is planning to make dramatic improvements in his diet and exercise.   Patient also has an ECHO scheduled for today.   Cardiac Hx: nonobstructive CAD, HTN  Current HTN meds:  Losartan 50mg  daily   BP goal: <130/80  Family History: Unknown as he was adopted.   Social History: He quit smoking about 7 years ago. He also previously was a drinker but now is alcohol free.   Diet: He has stopped using salts and seasonings and eats most of his meals from home. He endorses one cup of coffee per day.   Exercise: He joined a gym over the holiday and plans to start using the elliptical today.   Home BP readings: He has a wrist cuff, but has monitored only once since increase in losartan. He reports this pressure was 120s/80s.   Wt Readings from Last 3 Encounters:  05/27/16 283 lb 12.8 oz (128.7 kg)  01/29/13 233 lb 12.8 oz (106.1 kg)  11/06/12 240 lb 1.9 oz (108.9 kg)   BP Readings from Last 3 Encounters:  06/18/16 (!) 152/82  05/27/16 (!) 164/112  04/26/16 (!) 174/111   Pulse Readings from Last 3 Encounters:  06/18/16 87  05/27/16 88  04/26/16 102    Renal function: CrCl cannot be calculated (Patient's most recent lab result is older than the maximum 21 days allowed.).  Past Medical History:  Diagnosis Date  . Chest pain   . Coronary artery disease   . Gout   . Hypertension     Current Outpatient Prescriptions on File Prior to Visit   Medication Sig Dispense Refill  . aspirin EC 81 MG tablet Take 1 tablet (81 mg total) by mouth daily.    . colchicine 0.6 MG tablet Take 1 tablet (0.6 mg total) by mouth daily. 90 tablet 1  . losartan (COZAAR) 50 MG tablet Take 1 tablet (50 mg total) by mouth daily. 90 tablet 1  . Multiple Vitamin (MULTIVITAMIN WITH MINERALS) TABS Take 1 tablet by mouth daily.     No current facility-administered medications on file prior to visit.     No Known Allergies  Blood pressure (!) 152/82, pulse 87, SpO2 98 %.   Assessment/Plan: Hypertension: BMET today after increase in losartan dose. BP not at goal today, but is improved from previous office visits. Patient wishes to defer additional increase in medications today as he knows his pressures will improve with exercise and diet changes. Instructed him to monitor daily until follow up with Dr. Okey Dupre. If pressures remain elevated at that time would recommend increase in losartan. Follow up with Dr. Okey Dupre on January 25th as scheduled and hypertension clinic after as needed for additional medication titration.  Thank you, Freddie Apley. Cleatis Polka, PharmD  [facility]  06/18/2016 9:14 AM

## 2016-06-18 NOTE — Patient Instructions (Addendum)
Return for a follow up appointment in 3-4 weeks  Check your blood pressure at home daily (if able) and keep record of the readings.  Take your BP meds as follows: Continue losartan 50mg  daily  TAKE blood pressure once daily and bring log to appt with Dr. END at end of January.   Bring all of your meds, your BP cuff and your record of home blood pressures to your next appointment.  Exercise as you're able, try to walk approximately 30 minutes per day.  Keep salt intake to a minimum, especially watch canned and prepared boxed foods.  Eat more fresh fruits and vegetables and fewer canned items.  Avoid eating in fast food restaurants.    HOW TO TAKE YOUR BLOOD PRESSURE: . Rest 5 minutes before taking your blood pressure. .  Don't smoke or drink caffeinated beverages for at least 30 minutes before. . Take your blood pressure before (not after) you eat. . Sit comfortably with your back supported and both feet on the floor (don't cross your legs). . Elevate your arm to heart level on a table or a desk. . Use the proper sized cuff. It should fit smoothly and snugly around your bare upper arm. There should be enough room to slip a fingertip under the cuff. The bottom edge of the cuff should be 1 inch above the crease of the elbow. . Ideally, take 3 measurements at one sitting and record the average.

## 2016-06-19 LAB — BASIC METABOLIC PANEL
BUN/Creatinine Ratio: 15 (ref 9–20)
BUN: 16 mg/dL (ref 6–24)
CALCIUM: 9.7 mg/dL (ref 8.7–10.2)
CHLORIDE: 99 mmol/L (ref 96–106)
CO2: 24 mmol/L (ref 18–29)
Creatinine, Ser: 1.07 mg/dL (ref 0.76–1.27)
GFR, EST AFRICAN AMERICAN: 90 mL/min/{1.73_m2} (ref >59)
GFR, EST NON AFRICAN AMERICAN: 78 mL/min/{1.73_m2} (ref >59)
Glucose: 109 mg/dL — ABNORMAL HIGH (ref 65–99)
POTASSIUM: 4.5 mmol/L (ref 3.5–5.2)
SODIUM: 141 mmol/L (ref 134–144)

## 2016-06-20 ENCOUNTER — Telehealth: Payer: Self-pay | Admitting: Internal Medicine

## 2016-06-20 DIAGNOSIS — I429 Cardiomyopathy, unspecified: Secondary | ICD-10-CM

## 2016-06-20 DIAGNOSIS — I10 Essential (primary) hypertension: Secondary | ICD-10-CM

## 2016-06-20 NOTE — Telephone Encounter (Signed)
I spoke with Todd Meyer regarding results of recent echo showed reduced LV function (EF 40-45%) with global hypokinesis.  I suspect this likely represents effects of longstanding hypertension, though progression of his previously mild non-obstructive CAD could also be contributing.  We will proceed with an exercise myocardial perfusion stress tests and follow-up as planned later this month.  The patient is hesitant to increase losartan further at this time.  We will readdress this, as well as adding beta-blocker and statin, when he returns to see me.  Yvonne Kendall, MD [facility] Pager: [phone]

## 2016-06-20 NOTE — Telephone Encounter (Signed)
I spoke with patient and discussed instructions for stress myoview with pt. Pt verbalized understanding. I will place order for stress myoview and forward to [facility] to contact pt schedule appt for stress myoview prior to 07/11/16 appt with Dr End.

## 2016-06-25 ENCOUNTER — Telehealth (HOSPITAL_COMMUNITY): Payer: Self-pay | Admitting: *Deleted

## 2016-06-25 NOTE — Telephone Encounter (Signed)
Patient given detailed instructions per Myocardial Perfusion Study Information Sheet for the test on 06/27/16 at 0945. Patient notified to arrive 15 minutes early and that it is imperative to arrive on time for appointment to keep from having the test rescheduled.  If you need to cancel or reschedule your appointment, please call the office within 24 hours of your appointment. Failure to do so may result in a cancellation of your appointment, and a $50 no show fee. Patient verbalized understanding.Alverto Shedd, Adelene Idler

## 2016-06-27 ENCOUNTER — Ambulatory Visit (HOSPITAL_COMMUNITY): Payer: BLUE CROSS/BLUE SHIELD | Attending: Cardiovascular Disease

## 2016-06-27 DIAGNOSIS — I429 Cardiomyopathy, unspecified: Secondary | ICD-10-CM | POA: Diagnosis not present

## 2016-06-27 DIAGNOSIS — I2510 Atherosclerotic heart disease of native coronary artery without angina pectoris: Secondary | ICD-10-CM | POA: Insufficient documentation

## 2016-06-27 DIAGNOSIS — I10 Essential (primary) hypertension: Secondary | ICD-10-CM | POA: Insufficient documentation

## 2016-06-27 DIAGNOSIS — R0600 Dyspnea, unspecified: Secondary | ICD-10-CM | POA: Insufficient documentation

## 2016-06-27 DIAGNOSIS — I4510 Unspecified right bundle-branch block: Secondary | ICD-10-CM | POA: Diagnosis not present

## 2016-06-27 DIAGNOSIS — R0602 Shortness of breath: Secondary | ICD-10-CM | POA: Insufficient documentation

## 2016-06-27 DIAGNOSIS — R079 Chest pain, unspecified: Secondary | ICD-10-CM | POA: Diagnosis present

## 2016-06-27 LAB — MYOCARDIAL PERFUSION IMAGING
CHL CUP MPHR: 166 {beats}/min
CHL CUP NUCLEAR SDS: 0
CHL CUP NUCLEAR SRS: 6
CHL CUP RESTING HR STRESS: 88 {beats}/min
CSEPED: 6 min
CSEPHR: 90 %
CSEPPHR: 151 {beats}/min
Estimated workload: 7 METS
Exercise duration (sec): 0 s
LHR: 0.3
LV dias vol: 135 mL (ref 62–150)
LVSYSVOL: 82 mL
SSS: 6
TID: 0.96

## 2016-06-27 MED ORDER — TECHNETIUM TC 99M TETROFOSMIN IV KIT
32.7000 | PACK | Freq: Once | INTRAVENOUS | Status: AC | PRN
  Administered 2016-06-27: 32.7 via INTRAVENOUS
  Filled 2016-06-27: qty 33

## 2016-06-27 MED ORDER — TECHNETIUM TC 99M TETROFOSMIN IV KIT
10.2000 | PACK | Freq: Once | INTRAVENOUS | Status: AC | PRN
  Administered 2016-06-27: 10.2 via INTRAVENOUS
  Filled 2016-06-27: qty 11

## 2016-06-28 ENCOUNTER — Telehealth: Payer: Self-pay | Admitting: Internal Medicine

## 2016-06-28 MED ORDER — CARVEDILOL 6.25 MG PO TABS: 6.2500 mg | ORAL_TABLET | Freq: Two times a day (BID) | ORAL | 5 refills | Status: DC

## 2016-06-28 NOTE — Telephone Encounter (Signed)
I spoke with Mr. Sciullo regarding his stress test.  There was no evidence of ischemia or scar with moderately reduced LV function.  We discussed further treatment options and will add carvedilol 6.25 mg BID for treatment of his NICM and hypertension.  He will remain on his current dose of losartan.  We will follow-up as planned on 07/11/16.  Yvonne Kendall, MD [facility] Pager: [phone]

## 2016-07-11 ENCOUNTER — Ambulatory Visit (INDEPENDENT_AMBULATORY_CARE_PROVIDER_SITE_OTHER): Payer: BLUE CROSS/BLUE SHIELD | Admitting: Internal Medicine

## 2016-07-11 ENCOUNTER — Encounter: Payer: Self-pay | Admitting: Internal Medicine

## 2016-07-11 VITALS — BP 156/98 | HR 88 | Ht 72.0 in | Wt 266.0 lb

## 2016-07-11 DIAGNOSIS — I428 Other cardiomyopathies: Secondary | ICD-10-CM

## 2016-07-11 DIAGNOSIS — I2510 Atherosclerotic heart disease of native coronary artery without angina pectoris: Secondary | ICD-10-CM | POA: Diagnosis not present

## 2016-07-11 DIAGNOSIS — Z6836 Body mass index (BMI) 36.0-36.9, adult: Secondary | ICD-10-CM

## 2016-07-11 DIAGNOSIS — I119 Hypertensive heart disease without heart failure: Secondary | ICD-10-CM | POA: Diagnosis not present

## 2016-07-11 DIAGNOSIS — I10 Essential (primary) hypertension: Secondary | ICD-10-CM

## 2016-07-11 DIAGNOSIS — E669 Obesity, unspecified: Secondary | ICD-10-CM

## 2016-07-11 DIAGNOSIS — IMO0001 Reserved for inherently not codable concepts without codable children: Secondary | ICD-10-CM

## 2016-07-11 MED ORDER — ROSUVASTATIN CALCIUM 5 MG PO TABS: 5.0000 mg | ORAL_TABLET | Freq: Every day | ORAL | 1 refills | Status: DC

## 2016-07-11 MED ORDER — LOSARTAN POTASSIUM 100 MG PO TABS: 100.0000 mg | ORAL_TABLET | Freq: Every day | ORAL | 1 refills | Status: DC

## 2016-07-11 NOTE — Progress Notes (Signed)
Follow-up Outpatient Visit Date: 07/11/2016  Primary Care Provider: No PCP Per Patient No address on file  Chief Complaint: Follow-up hypertension  HPI:  Todd Meyer is a 56 y.o. year-old male with history of non-obstructive CAD, HTN, and gout, who presents for follow-up of hypertension. I last saw the patient on 05/27/16 after he was found to have significant hypertension at [facility]. We increased his losartan from 25 -> 50 mg daily and proceeded with echo to evaluate for structural heart disease. This revealed moderately reduced LVEF, with subsequent stress test showing no significant ischemia or scar. Following these tests, we agreed to add carvedilol 6.25 mg BID. Unfortunately, there was a miscommunication, and the patient stopped taking losartan when he began carvedilol. Today he reports feeling well, though his home blood pressures have remained high. He denies chest pain, shortness of breath, and edema. He has not been exercising, as he is nervous about potential negative cardiovascular effects of exercise.  --------------------------------------------------------------------------------------------------  Cardiovascular History & Procedures: Cardiovascular Problems:  Non-obstructive CAD  Non-ischemic cardiomyopathy  Uncontrolled hypertension  Risk Factors:  Known coronary artery disease, hypertension, impaired glucose tolerance, obesity, sedentary lifestyle, and male gender  Cath/PCI:  None  CV Surgery:  None  EP Procedures and Devices:  None  Non-Invasive Evaluation(s):  Exercise myocardial perfusion stress test (06/27/16): Low risk study without ischemia or scar. LVEF 40%.  Transthoracic echocardiogram (06/18/16): Normal LV size with mild LVH. LVEF 40-45% with grade 1 diastolic dysfunction. Mild left atrial enlargement. Normal RV size and function. No significant valvular abnormalities.  Coronary CTA (03/14/12): LMCA normal. LAD with eccentric  calcified plaque at the ostium without any associated luminal irregularities. No significant disease involving the ramus LCx, or RCA. Calcium score 55 Agatston units. Aortic root upper normal limits of size.  Recent CV Pertinent Labs: Lab Results  Component Value Date   CHOL 187 05/27/2016   HDL 42 05/27/2016   LDLCALC 123 (H) 05/27/2016   LDLDIRECT 156.7 04/08/2012   TRIG 108 05/27/2016   CHOLHDL 4.5 05/27/2016   INR 0.98 03/13/2012   K 4.5 06/18/2016   BUN 16 06/18/2016   CREATININE 1.07 06/18/2016   CREATININE 0.89 05/27/2016    Past medical and surgical history were reviewed and updated in EPIC.  Current Outpatient Prescriptions on File Prior to Visit  Medication Sig Dispense Refill  . aspirin EC 81 MG tablet Take 1 tablet (81 mg total) by mouth daily.    . carvedilol (COREG) 6.25 MG tablet Take 1 tablet (6.25 mg total) by mouth 2 (two) times daily with a meal. 60 tablet 5  . colchicine 0.6 MG tablet Take 1 tablet (0.6 mg total) by mouth daily. 90 tablet 1  . Multiple Vitamin (MULTIVITAMIN WITH MINERALS) TABS Take 1 tablet by mouth daily.     No current facility-administered medications on file prior to visit.     Allergies: Patient has no known allergies.  Social History   Social History  . Marital status: Married    Spouse name: N/A  . Number of children: N/A  . Years of education: N/A   Occupational History  . Not on file.   Social History Main Topics  . Smoking status: Former Smoker    Packs/day: 1.00    Years: 25.00    Quit date: 2010  . Smokeless tobacco: Never Used  . Alcohol use No  . Drug use: No  . Sexual activity: Not on file   Other Topics Concern  . Not  on file   Social History Narrative  . No narrative on file    Family History  Problem Relation Age of Onset  . Adopted: Yes    Review of Systems: A 12-system review of systems was performed and was negative except as noted in the  HPI.  --------------------------------------------------------------------------------------------------  Physical Exam: BP (!) 156/98 (BP Location: Right Arm, Patient Position: Sitting, Cuff Size: Large)   Pulse 88   Ht 6' (1.829 m)   Wt 266 lb (120.7 kg)   BMI 36.08 kg/m   General:  Obese man, seated comfortably in exam room. HEENT: No conjunctival pallor or scleral icterus.  Moist mucous membranes.  OP clear. Neck: Supple without lymphadenopathy, thyromegaly, JVD, or HJR. Lungs: Normal work of breathing.  Clear to auscultation bilaterally without wheezes or crackles. Heart: Regular rate and rhythm without murmurs, rubs, or gallops.  Non-displaced PMI. Abd: Bowel sounds present.  Soft, NT/ND without hepatosplenomegaly Ext: No lower extremity edema.  Radial, PT, and DP pulses are 2+ bilaterally. Skin: warm and dry without rash   Lab Results  Component Value Date   WBC 6.7 01/29/2013   HGB 15.0 01/29/2013   HCT 44.5 01/29/2013   MCV 89.3 01/29/2013   PLT 271.0 01/29/2013    Lab Results  Component Value Date   NA 141 06/18/2016   K 4.5 06/18/2016   CL 99 06/18/2016   CO2 24 06/18/2016   BUN 16 06/18/2016   CREATININE 1.07 06/18/2016   GLUCOSE 109 (H) 06/18/2016   ALT 25 05/27/2016    Lab Results  Component Value Date   CHOL 187 05/27/2016   HDL 42 05/27/2016   LDLCALC 123 (H) 05/27/2016   LDLDIRECT 156.7 04/08/2012   TRIG 108 05/27/2016   CHOLHDL 4.5 05/27/2016    --------------------------------------------------------------------------------------------------  ASSESSMENT AND PLAN: Essential hypertension Blood pressure remains suboptimally controlled, though patient accidentally stopped taking losartan when carvedilol was added. We will restart losartan, albeit at 100 mg daily, and continue with carvedilol 6.25 mg BID. I will have the patient return in about 2 weeks for a basic metabolic panel and blood pressure check.  Non-ischemic cardiomyopathy and  hypertensive heart disease Recent echo and stress test revealed moderate LV systolic dysfunction. The patient appears euvolemic and well-compensated without heart failure symptoms. We will continue with carvedilol and start back losartan, as above.  Coronary artery disease Mild coronary artery plaquing noted on coronary CTA in 2013. The patient was on atorvastatin at one point but stopped this due to myalgias. We have agreed to a trial of rosuvastatin 5 mg daily to prevent progression of CAD.  Obesity Patient is very eager to begin exercising and losing weight. I think it is fine for him to begin moderate cardiovascular exercise on a regular basis, as well as dietary modification to assist with weight loss.  Follow-up: Return for BMP and blood pressure check in 2 weeks; follow-up with me in 3 months.  Yvonne Kendall, MD 07/12/2016 3:53 PM

## 2016-07-11 NOTE — Patient Instructions (Signed)
Medication Instructions:  Start losartan 100 mg daily.  Start Crestor (rosuvastatin) 5 mg daily  Labwork: BMET in 2 weeks.  Testing/Procedures: None  Follow-Up: Your physician recommends that you schedule a follow-up appointment in: 2 weeks for BP check in BP Clinic-same day as lab.   Your physician recommends that you schedule a follow-up appointment in: 3 months with Dr End.          If you need a refill on your cardiac medications before your next appointment, please call your pharmacy.

## 2016-07-12 ENCOUNTER — Encounter: Payer: Self-pay | Admitting: Internal Medicine

## 2016-07-24 NOTE — Progress Notes (Signed)
Patient ID: Todd Meyer                 DOB: [birth year]                      MRN: [id number]     HPI: Todd Meyer is a pleasant 56 y.o. male referred by Dr. Okey Dupre to HTN clinic. PMH is significant for HTN, non-obstructive CAD, and gout. Echo showed LVEF of 40-45% and stress test showed no significant ischemia or scarring. At last visit 2 weeks ago, pt was restarted on losartan at 100mg  daily and continued on carvedilol 6.25mg  BID. He presents today for BMET and HTN visit.   Pt is tolerating his medications well and reports adherence to losartan and carvedilol. He is extremely motivated to continue with his lifestyle changes and improve his diet and activity levels. His goal weight is 190 lbs. He has already lost 20 pounds in the past 2 months. He has been eating vegetables, grilled meat, and only drinking water. He cut out sugar including soda and juice from his diet.  Current HTN meds: losartan 100mg  daily, carvedilol 6.25mg  BID Previously tried: avoiding thiazide diuretics due to gout BP goal: <130/8mHg  Family History: Adopted.  Social History: Former smoker 1 PPD for 25 years, quit in 2010. Denies alcohol and illicit drug use.  Diet: He has stopped using salts and seasonings and eats most of his meals from home. He endorses one cup of coffee per day.   Exercise: He joined a gym over the holiday and plans to start using the elliptical.   Home BP readings: Has not been checking/  Wt Readings from Last 3 Encounters:  07/11/16 266 lb (120.7 kg)  06/27/16 273 lb (123.8 kg)  06/18/16 273 lb 8 oz (124.1 kg)   BP Readings from Last 3 Encounters:  07/11/16 (!) 156/98  06/18/16 (!) 152/82  05/27/16 (!) 164/112   Pulse Readings from Last 3 Encounters:  07/11/16 88  06/18/16 87  05/27/16 88    Renal function: CrCl cannot be calculated (Patient's most recent lab result is older than the maximum 21 days allowed.).  Past Medical History:  Diagnosis Date  . Chest pain   .  Coronary artery disease   . Gout   . Hypertension     Current Outpatient Prescriptions on File Prior to Visit  Medication Sig Dispense Refill  . aspirin EC 81 MG tablet Take 1 tablet (81 mg total) by mouth daily.    . carvedilol (COREG) 6.25 MG tablet Take 1 tablet (6.25 mg total) by mouth 2 (two) times daily with a meal. 60 tablet 5  . colchicine 0.6 MG tablet Take 1 tablet (0.6 mg total) by mouth daily. 90 tablet 1  . losartan (COZAAR) 100 MG tablet Take 1 tablet (100 mg total) by mouth daily. 90 tablet 1  . Multiple Vitamin (MULTIVITAMIN WITH MINERALS) TABS Take 1 tablet by mouth daily.    . rosuvastatin (CRESTOR) 5 MG tablet Take 1 tablet (5 mg total) by mouth daily. 90 tablet 1   No current facility-administered medications on file prior to visit.     No Known Allergies   Assessment/Plan:  1. Hypertension - BP improved to 132/43mmHg since restarting losartan and continuing carvedilol. Pt is very motivated to make lifestyle improvements and has already lost over 20 pounds in the past 2 months. He would like to continue to focus on his diet and exercise rather than making medication adjustments  at this time. He will follow up with Dr End in 3 months and hopes to be able to stop medication at that time.   Megan E. Supple, PharmD, CPP, BCACP [facility] [address], [place], [place] [address] Phone: [phone]; Fax: [phone] 07/25/2016 8:54 AM

## 2016-07-25 ENCOUNTER — Ambulatory Visit (INDEPENDENT_AMBULATORY_CARE_PROVIDER_SITE_OTHER): Payer: BLUE CROSS/BLUE SHIELD | Admitting: Pharmacist

## 2016-07-25 ENCOUNTER — Other Ambulatory Visit: Payer: BLUE CROSS/BLUE SHIELD | Admitting: *Deleted

## 2016-07-25 VITALS — BP 132/94 | HR 92 | Wt 262.0 lb

## 2016-07-25 DIAGNOSIS — I10 Essential (primary) hypertension: Secondary | ICD-10-CM

## 2016-07-25 LAB — BASIC METABOLIC PANEL
BUN/Creatinine Ratio: 13 (ref 9–20)
BUN: 12 mg/dL (ref 6–24)
CO2: 23 mmol/L (ref 18–29)
Calcium: 9.1 mg/dL (ref 8.7–10.2)
Chloride: 101 mmol/L (ref 96–106)
Creatinine, Ser: 0.96 mg/dL (ref 0.76–1.27)
GFR calc Af Amer: 102 mL/min/{1.73_m2} (ref >59)
GFR calc non Af Amer: 89 mL/min/{1.73_m2} (ref >59)
GLUCOSE: 156 mg/dL — AB (ref 65–99)
POTASSIUM: 4.1 mmol/L (ref 3.5–5.2)
SODIUM: 140 mmol/L (ref 134–144)

## 2016-10-06 ENCOUNTER — Emergency Department (HOSPITAL_COMMUNITY)
Admission: EM | Admit: 2016-10-06 | Discharge: 2016-10-06 | Disposition: A | Discharge status: Home / Self Care | Payer: BLUE CROSS/BLUE SHIELD | Attending: Emergency Medicine | Admitting: Emergency Medicine

## 2016-10-06 ENCOUNTER — Emergency Department (HOSPITAL_COMMUNITY): Payer: BLUE CROSS/BLUE SHIELD

## 2016-10-06 ENCOUNTER — Encounter (HOSPITAL_COMMUNITY): Payer: Self-pay | Admitting: Emergency Medicine

## 2016-10-06 DIAGNOSIS — I2510 Atherosclerotic heart disease of native coronary artery without angina pectoris: Secondary | ICD-10-CM | POA: Diagnosis not present

## 2016-10-06 DIAGNOSIS — Z79899 Other long term (current) drug therapy: Secondary | ICD-10-CM | POA: Insufficient documentation

## 2016-10-06 DIAGNOSIS — Z7982 Long term (current) use of aspirin: Secondary | ICD-10-CM | POA: Insufficient documentation

## 2016-10-06 DIAGNOSIS — K5732 Diverticulitis of large intestine without perforation or abscess without bleeding: Secondary | ICD-10-CM | POA: Diagnosis not present

## 2016-10-06 DIAGNOSIS — I10 Essential (primary) hypertension: Secondary | ICD-10-CM | POA: Insufficient documentation

## 2016-10-06 DIAGNOSIS — Z87891 Personal history of nicotine dependence: Secondary | ICD-10-CM | POA: Diagnosis not present

## 2016-10-06 DIAGNOSIS — R1032 Left lower quadrant pain: Secondary | ICD-10-CM | POA: Diagnosis present

## 2016-10-06 LAB — CBC
HEMATOCRIT: 45.2 % (ref 39.0–52.0)
Hemoglobin: 15.1 g/dL (ref 13.0–17.0)
MCH: 29.4 pg (ref 26.0–34.0)
MCHC: 33.4 g/dL (ref 30.0–36.0)
MCV: 87.9 fL (ref 78.0–100.0)
PLATELETS: 319 10*3/uL (ref 150–400)
RBC: 5.14 MIL/uL (ref 4.22–5.81)
RDW: 14 % (ref 11.5–15.5)
WBC: 9.6 10*3/uL (ref 4.0–10.5)

## 2016-10-06 LAB — COMPREHENSIVE METABOLIC PANEL
ALBUMIN: 3.8 g/dL (ref 3.5–5.0)
ALT: 25 U/L (ref 17–63)
AST: 30 U/L (ref 15–41)
Alkaline Phosphatase: 74 U/L (ref 38–126)
Anion gap: 10 (ref 5–15)
BUN: 14 mg/dL (ref 6–20)
CHLORIDE: 101 mmol/L (ref 101–111)
CO2: 25 mmol/L (ref 22–32)
CREATININE: 0.94 mg/dL (ref 0.61–1.24)
Calcium: 9 mg/dL (ref 8.9–10.3)
GFR calc Af Amer: >60 mL/min (ref >60)
GLUCOSE: 111 mg/dL — AB (ref 65–99)
POTASSIUM: 5 mmol/L (ref 3.5–5.1)
SODIUM: 136 mmol/L (ref 135–145)
Total Bilirubin: 1.2 mg/dL (ref 0.3–1.2)
Total Protein: 7.6 g/dL (ref 6.5–8.1)

## 2016-10-06 LAB — URINALYSIS, ROUTINE W REFLEX MICROSCOPIC
Bilirubin Urine: NEGATIVE
GLUCOSE, UA: NEGATIVE mg/dL
HGB URINE DIPSTICK: NEGATIVE
KETONES UR: NEGATIVE mg/dL
LEUKOCYTES UA: NEGATIVE
Nitrite: NEGATIVE
PH: 5 (ref 5.0–8.0)
PROTEIN: NEGATIVE mg/dL
Specific Gravity, Urine: 1.017 (ref 1.005–1.030)

## 2016-10-06 LAB — LIPASE, BLOOD: LIPASE: 24 U/L (ref 11–51)

## 2016-10-06 MED ORDER — METRONIDAZOLE 500 MG PO TABS
500.0000 mg | ORAL_TABLET | Freq: Once | ORAL | Status: AC
  Administered 2016-10-06: 500 mg via ORAL
  Filled 2016-10-06: qty 1

## 2016-10-06 MED ORDER — CIPROFLOXACIN HCL 500 MG PO TABS: 500.0000 mg | ORAL_TABLET | Freq: Two times a day (BID) | ORAL | 0 refills | Status: AC

## 2016-10-06 MED ORDER — IOPAMIDOL (ISOVUE-300) INJECTION 61%
INTRAVENOUS | Status: AC
  Administered 2016-10-06: 100 mL
  Filled 2016-10-06: qty 100

## 2016-10-06 MED ORDER — METRONIDAZOLE 500 MG PO TABS: 500.0000 mg | ORAL_TABLET | Freq: Three times a day (TID) | ORAL | 0 refills | Status: AC

## 2016-10-06 MED ORDER — CIPROFLOXACIN HCL 500 MG PO TABS
500.0000 mg | ORAL_TABLET | Freq: Once | ORAL | Status: AC
  Administered 2016-10-06: 500 mg via ORAL
  Filled 2016-10-06: qty 1

## 2016-10-06 NOTE — ED Triage Notes (Signed)
Onset 3 weeks ago LLQ abdominal pain worsening in AM denies nausea emesis or diarrhea.

## 2016-10-06 NOTE — ED Provider Notes (Signed)
[facility]-EMERGENCY DEPT Provider Note   CSN: [id number] Arrival date & time: 10/06/16  0935     History   Chief Complaint Chief Complaint  Patient presents with  . Abdominal Pain    HPI Todd Meyer is a 56 y.o. male who presents today with 3 week history of progressively worsening, constant, crampy 8 out of 10 abdominal pain. He states pain is in the left lower quadrant which at times radiates to the suprapubic region. Certain movements cause radiation to the left testicle. He denies penile discharge, ulcerations, or scrotal swelling. Pain is worsened by sitting up and sleeping on his left side and somewhat alleviated by laying down. He took ibuprofen yesterday with some relief. 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 He denies nausea, vomiting, diarrhea, constipation, melena, hematochezia, dysuria. He does endorse pain in the mornings when he wakes up with a full bladder, and pain is relieved when he voids. He denies history of gallbladder dysfunction or kidney stones. He denies fever, chills, headache, chest pain, shortness of breath.  The history is provided by the patient and the spouse.    Past Medical History:  Diagnosis Date  . Chest pain   . Coronary artery disease   . Gout   . Hypertension     Patient Active Problem List   Diagnosis Date Noted  . H/O elevated lipids 03/31/2012  . Chest pain   . Hypertension     Past Surgical History:  Procedure Laterality Date  . STOMACH SURGERY     Fatty tissue removed       Home Medications    Prior to Admission medications   Medication Sig Start Date End Date Taking? Authorizing Provider  aspirin EC 81 MG tablet Take 1 tablet (81 mg total) by mouth daily. 05/27/16   Yvonne Kendall, MD  carvedilol (COREG) 6.25 MG tablet Take 1 tablet (6.25 mg total) by mouth 2 (two) times daily with a meal. 06/28/16   Yvonne Kendall, MD  ciprofloxacin (CIPRO) 500 MG tablet Take 1 tablet (500 mg total) by mouth every 12  (twelve) hours. 10/06/16 10/16/16  Edelmiro Innocent A Kush Farabee, PA-C  colchicine 0.6 MG tablet Take 1 tablet (0.6 mg total) by mouth daily. 05/27/16   Yvonne Kendall, MD  losartan (COZAAR) 100 MG tablet Take 1 tablet (100 mg total) by mouth daily. 07/11/16 10/09/16  Yvonne Kendall, MD  metroNIDAZOLE (FLAGYL) 500 MG tablet Take 1 tablet (500 mg total) by mouth every 8 (eight) hours. 10/06/16 10/16/16  Kiora Hallberg A Lacreshia Bondarenko, PA-C  Multiple Vitamin (MULTIVITAMIN WITH MINERALS) TABS Take 1 tablet by mouth daily.    Historical Provider, MD  rosuvastatin (CRESTOR) 5 MG tablet Take 1 tablet (5 mg total) by mouth daily. 07/11/16 10/09/16  Yvonne Kendall, MD    Family History Family History  Problem Relation Age of Onset  . Adopted: Yes    Social History Social History  Substance Use Topics  . Smoking status: Former Smoker    Packs/day: 1.00    Years: 25.00    Quit date: 2010  . Smokeless tobacco: Never Used  . Alcohol use No     Allergies   Patient has no known allergies.   Review of Systems Review of Systems  Constitutional: Negative for chills and fever.  Respiratory: Negative for shortness of breath.   Cardiovascular: Negative for chest pain.  Gastrointestinal: Positive for abdominal pain. Negative for blood in stool, constipation, diarrhea, nausea and vomiting.  Genitourinary: Positive for testicular pain.  Negative for dysuria and hematuria.  Musculoskeletal: Positive for back pain. Negative for neck pain.       Chronic, unchanged.   Neurological: Negative for headaches.     Physical Exam Updated Vital Signs BP 116/78   Pulse 64   Temp 98.6 F (37 C) (Oral)   Resp 18   Ht 5\' 10"  (1.778 m)   Wt 108.9 kg   SpO2 99%   BMI 34.44 kg/m   Physical Exam  Constitutional: He is oriented to person, place, and time.  HENT:  Head: Normocephalic and atraumatic.  Eyes: Conjunctivae and EOM are normal. Right eye exhibits no discharge. Left eye exhibits no discharge. No scleral icterus.  Neck: No JVD  present. No tracheal deviation present.  Cardiovascular: Normal rate, regular rhythm and normal heart sounds.   Pulmonary/Chest: Effort normal and breath sounds normal.  Abdominal: Soft. Bowel sounds are normal. He exhibits no distension. There is tenderness. There is guarding. There is no rebound.  Tenderness to palpation of left lower quadrant. No suprapubic tenderness. Negative Murphy's, negative Rovsing's, no tenderness to palpation at McBurney's point, negative psoas. No masses palpated  Genitourinary: Penis normal. No penile tenderness.  Genitourinary Comments: No inguinal LAD. No scrotal masses. No ttp of testes or epididymis b/l. No "bag of worms" palpated. Penis without lesion, drainage, or erythema. Examination chaperoned by nurse.   Musculoskeletal: Normal range of motion.  Neurological: He is alert and oriented to person, place, and time.  Fluent speech, no facial droop  Skin: Skin is warm and dry.     ED Treatments / Results  Labs (all labs ordered are listed, but only abnormal results are displayed) Labs Reviewed  COMPREHENSIVE METABOLIC PANEL - Abnormal; Notable for the following:       Result Value   Glucose, Bld 111 (*)    All other components within normal limits  LIPASE, BLOOD  CBC  URINALYSIS, ROUTINE W REFLEX MICROSCOPIC    EKG  EKG Interpretation None       Radiology Ct Abdomen Pelvis W Contrast  Result Date: 10/06/2016 CLINICAL DATA:  Left upper quadrant abdominal pain for the past 2 weeks, worsening. EXAM: CT ABDOMEN AND PELVIS WITH CONTRAST TECHNIQUE: Multidetector CT imaging of the abdomen and pelvis was performed using the standard protocol following bolus administration of intravenous contrast. CONTRAST:  ISOVUE-300 IOPAMIDOL (ISOVUE-300) INJECTION 61% COMPARISON:  10/06/2007. FINDINGS: Lower chest: Unremarkable. Hepatobiliary: No focal liver abnormality is seen. No gallstones, gallbladder wall thickening, or biliary dilatation. Pancreas:  Unremarkable. No pancreatic ductal dilatation or surrounding inflammatory changes. Spleen: Normal in size without focal abnormality. Adrenals/Urinary Tract: Adrenal glands are unremarkable. Kidneys are normal, without renal calculi, focal lesion, or hydronephrosis. Bladder is unremarkable. Stomach/Bowel: Multiple proximal sigmoid colon diverticula with adjacent pericolonic soft tissue stranding. No fluid collections or free peritoneal air. Normal appearing appendix, stomach and small bowel. Vascular/Lymphatic: Atheromatous arterial calcifications without aneurysm. No enlarged lymph nodes. Reproductive: Prostate is unremarkable. Other: No abdominal wall hernia or abnormality. No abdominopelvic ascites. Musculoskeletal: Grade 1 retrolisthesis at the L4-5 level with mild to moderate diffuse posterior disc bulging, mild anterior and minimal posterior spur formation. IMPRESSION: Proximal sigmoid colon diverticulosis and diverticulitis without abscess. Electronically Signed   By: Beckie Salts M.D.   On: 10/06/2016 12:29    Procedures Procedures (including critical care time)  Medications Ordered in ED Medications  iopamidol (ISOVUE-300) 61 % injection (100 mLs  Contrast Given 10/06/16 1202)  ciprofloxacin (CIPRO) tablet 500 mg (500 mg Oral Given 10/06/16  1255)  metroNIDAZOLE (FLAGYL) tablet 500 mg (500 mg Oral Given 10/06/16 1255)     Initial Impression / Assessment and Plan / ED Course  I have reviewed the triage vital signs and the nursing notes.  Pertinent labs & imaging results that were available during my care of the patient were reviewed by me and considered in my medical decision making (see chart for details).     Patient with progressively worsening LLQ pain with no nausea, vomiting, or diarrhea. Pt afebrile, VSS while in ED. Labwork unremarkable. CT shows mild proximal sigmoid diverticulosis and diverticulitis without abscess or perforation. In a well-appearing afebrile male with uncomplicated  diverticulitis, will treat outpt. First dose abx given in ED and rx for remainder of cipro and flagyl given at discharge. Recommend f/u with PCP in 3-4 days for re-evaluation. Discussed dietary concerns with pt. Discussed strict ED return precautions. Pt verbalized understanding of and agreement with plan and is safe for discharge home at this time.   Final Clinical Impressions(s) / ED Diagnoses   Final diagnoses:  Diverticulitis large intestine w/o perforation or abscess w/o bleeding    New Prescriptions Discharge Medication List as of 10/06/2016 12:59 PM    START taking these medications   Details  ciprofloxacin (CIPRO) 500 MG tablet Take 1 tablet (500 mg total) by mouth every 12 (twelve) hours., Starting Sun 10/06/2016, Until Wed 10/16/2016, Print    metroNIDAZOLE (FLAGYL) 500 MG tablet Take 1 tablet (500 mg total) by mouth every 8 (eight) hours., Starting Sun 10/06/2016, Until Wed 10/16/2016, Print         Jeanie Sewer, PA-C 10/07/16 1434    Donnetta Hutching, MD 10/08/16 1244

## 2016-10-06 NOTE — Discharge Instructions (Signed)
Please take all of your antibiotics until finished!   You may develop abdominal discomfort or diarrhea from the antibiotic.  You may help offset this with probiotics which you can buy or get in yogurt. Do not eat  or take the probiotics until 2 hours after your antibiotic.   

## 2016-10-17 ENCOUNTER — Encounter: Payer: Self-pay | Admitting: Internal Medicine

## 2016-10-17 ENCOUNTER — Ambulatory Visit (INDEPENDENT_AMBULATORY_CARE_PROVIDER_SITE_OTHER): Payer: BLUE CROSS/BLUE SHIELD | Admitting: Internal Medicine

## 2016-10-17 VITALS — BP 138/84 | HR 72 | Ht 70.0 in | Wt 249.1 lb

## 2016-10-17 DIAGNOSIS — I10 Essential (primary) hypertension: Secondary | ICD-10-CM | POA: Diagnosis not present

## 2016-10-17 DIAGNOSIS — I428 Other cardiomyopathies: Secondary | ICD-10-CM | POA: Diagnosis not present

## 2016-10-17 DIAGNOSIS — I2510 Atherosclerotic heart disease of native coronary artery without angina pectoris: Secondary | ICD-10-CM

## 2016-10-17 DIAGNOSIS — E7800 Pure hypercholesterolemia, unspecified: Secondary | ICD-10-CM

## 2016-10-17 LAB — LIPID PANEL
CHOLESTEROL TOTAL: 97 mg/dL — AB (ref 100–199)
Chol/HDL Ratio: 2.4 ratio (ref 0.0–5.0)
HDL: 40 mg/dL (ref >39)
LDL Calculated: 47 mg/dL (ref 0–99)
TRIGLYCERIDES: 52 mg/dL (ref 0–149)
VLDL Cholesterol Cal: 10 mg/dL (ref 5–40)

## 2016-10-17 LAB — ALT: ALT: 37 IU/L (ref 0–44)

## 2016-10-17 NOTE — Patient Instructions (Signed)
Medication Instructions:  Your physician recommends that you continue on your current medications as directed. Please refer to the Current Medication list given to you today.   Labwork: Lipid profile/ALT today  Testing/Procedures: None   Follow-Up: Your physician wants you to follow-up in: 6 months with Dr End. (November 2018)  You will receive a reminder letter in the mail two months in advance. If you don't receive a letter, please call our office to schedule the follow-up appointment.        If you need a refill on your cardiac medications before your next appointment, please call your pharmacy.

## 2016-10-17 NOTE — Progress Notes (Signed)
Follow-up Outpatient Visit Date: 10/17/2016  Primary Care Provider: No PCP Per Patient No address on file  Chief Complaint: Follow-up hypertension  HPI:  Todd Meyer is a 56 y.o. year-old male with history of non-obstructive CAD, HTN, and gout, who presents for follow-up of hypertension. I last saw the patient on 07/11/16 for follow-up of hypertension and non-ischemic cardiomyopathy. We have progressively increased his antihypertensive regimen to avoid all 6.25 mg twice a day and losartan 100 mg daily. Mr. Sahr has been exercising regularly and following a strict diet with low-sodium and low carbohydrate intake. He has lost about 30 pounds over the last 6 months and is feeling very well. He exercises on the elliptical trainer 30 minutes a day and will like to increase this further. He has not had any chest pain, shortness of breath, palpitations, lightheadedness, edema, or claudication. He is tolerating his medications well, including rosuvastatin that was started back in January.  --------------------------------------------------------------------------------------------------  Cardiovascular History & Procedures: Cardiovascular Problems:  Non-obstructive CAD  Non-ischemic cardiomyopathy  Uncontrolled hypertension  Risk Factors:  Known coronary artery disease, hypertension, impaired glucose tolerance, obesity, and male gender  Cath/PCI:  None  CV Surgery:  None  EP Procedures and Devices:  None  Non-Invasive Evaluation(s):  Exercise myocardial perfusion stress test (06/27/16): Low risk study without ischemia or scar. LVEF 40%.  Transthoracic echocardiogram (06/18/16): Normal LV size with mild LVH. LVEF 40-45% with grade 1 diastolic dysfunction. Mild left atrial enlargement. Normal RV size and function. No significant valvular abnormalities.  Coronary CTA (03/14/12): LMCA normal. LAD with eccentric calcified plaque at the ostium without any associated luminal irregularities.  No significant disease involving the ramus LCx, or RCA. Calcium score 55 Agatston units. Aortic root upper normal limits of size.  Recent CV Pertinent Labs: Lab Results  Component Value Date   CHOL 187 05/27/2016   HDL 42 05/27/2016   LDLCALC 123 (H) 05/27/2016   LDLDIRECT 156.7 04/08/2012   TRIG 108 05/27/2016   CHOLHDL 4.5 05/27/2016   INR 0.98 03/13/2012   K 5.0 10/06/2016   BUN 14 10/06/2016   BUN 12 07/25/2016   CREATININE 0.94 10/06/2016   CREATININE 0.89 05/27/2016    Past medical and surgical history were reviewed and updated in EPIC.  Current Outpatient Prescriptions on File Prior to Visit  Medication Sig Dispense Refill  . aspirin EC 81 MG tablet Take 1 tablet (81 mg total) by mouth daily.    . carvedilol (COREG) 6.25 MG tablet Take 1 tablet (6.25 mg total) by mouth 2 (two) times daily with a meal. 60 tablet 5  . Multiple Vitamin (MULTIVITAMIN WITH MINERALS) TABS Take 1 tablet by mouth daily.    . colchicine 0.6 MG tablet Take 1 tablet (0.6 mg total) by mouth daily. (Patient not taking: Reported on 10/17/2016) 90 tablet 1  . losartan (COZAAR) 100 MG tablet Take 1 tablet (100 mg total) by mouth daily. 90 tablet 1  . rosuvastatin (CRESTOR) 5 MG tablet Take 1 tablet (5 mg total) by mouth daily. 90 tablet 1   No current facility-administered medications on file prior to visit.     Allergies: Patient has no known allergies.  Social History   Social History  . Marital status: Married    Spouse name: N/A  . Number of children: N/A  . Years of education: N/A   Occupational History  . Not on file.   Social History Main Topics  . Smoking status: Former Smoker    Packs/day: 1.00  Years: 25.00    Quit date: 2010  . Smokeless tobacco: Never Used  . Alcohol use No  . Drug use: No  . Sexual activity: Not on file   Other Topics Concern  . Not on file   Social History Narrative  . No narrative on file    Family History  Problem Relation Age of Onset  .  Adopted: Yes    Review of Systems: A 12-system review of systems was performed and was negative except as noted in the HPI.  --------------------------------------------------------------------------------------------------  Physical Exam: BP 138/84   Pulse 72   Ht 5\' 10"  (1.778 m)   Wt 249 lb 1.9 oz (113 kg)   SpO2 98%   BMI 35.74 kg/m   General:  Obese man, seated comfortably in exam room. HEENT: No conjunctival pallor or scleral icterus.  Moist mucous membranes.  OP clear. Neck: Supple without lymphadenopathy, thyromegaly, JVD, or HJR. Lungs: Normal work of breathing.  Clear to auscultation bilaterally without wheezes or crackles. Heart: Regular rate and rhythm without murmurs, rubs, or gallops.  Non-displaced PMI. Abd: Bowel sounds present.  Soft, NT/ND without hepatosplenomegaly Ext: No lower extremity edema.  Radial, PT, and DP pulses are 2+ bilaterally. Skin: warm and dry without rash  Lab Results  Component Value Date   WBC 9.6 10/06/2016   HGB 15.1 10/06/2016   HCT 45.2 10/06/2016   MCV 87.9 10/06/2016   PLT 319 10/06/2016    Lab Results  Component Value Date   NA 136 10/06/2016   K 5.0 10/06/2016   CL 101 10/06/2016   CO2 25 10/06/2016   BUN 14 10/06/2016   CREATININE 0.94 10/06/2016   GLUCOSE 111 (H) 10/06/2016   ALT 25 10/06/2016    Lab Results  Component Value Date   CHOL 187 05/27/2016   HDL 42 05/27/2016   LDLCALC 123 (H) 05/27/2016   LDLDIRECT 156.7 04/08/2012   TRIG 108 05/27/2016   CHOLHDL 4.5 05/27/2016    --------------------------------------------------------------------------------------------------  ASSESSMENT AND PLAN: Essential hypertension Mr. Coulbourn blood pressure is upper normal today but has been better controlled on recent visits. He is losing weight through diet and exercise, which I have encouraged him to continue. We will continue his current medication regimen of carvedilol and losartan.  Non-ischemic cardiomyopathy  and hypertensive heart disease Mr. Cuppett appears euvolemic and well compensated with NYHA class I heart failure. He is tolerating evidence-based heart failure therapy well. I have encouraged him to continue his current medications and lifestyle modifications, which seemed to be working well for him. We will discuss repeating an echocardiogram at follow-up in 6 months.  Coronary artery disease Mild coronary artery plaquing noted on coronary CTA in 2013. Myocardial perfusion stress test in January showed no evidence of ischemia or scar. We will continue with primary prevention, including aspirin, lipid therapy, and blood pressure control..  Hyperlipidemia Given history of CAD, LDL was noted to be mildly elevated in 05/2016. Mr. Savala is tolerating rosuvastatin 5 mg daily well. We will repeat a lipid panel and ALT to monitor response to therapy.  Follow-up: Return to clinic in 6 months.  Yvonne Kendall, MD 10/17/2016 9:10 AM

## 2016-11-21 ENCOUNTER — Other Ambulatory Visit: Payer: Self-pay | Admitting: *Deleted

## 2016-11-21 DIAGNOSIS — I10 Essential (primary) hypertension: Secondary | ICD-10-CM

## 2016-11-21 MED ORDER — LOSARTAN POTASSIUM 100 MG PO TABS: 100.0000 mg | ORAL_TABLET | Freq: Every day | ORAL | 3 refills | Status: DC

## 2016-12-06 ENCOUNTER — Telehealth: Payer: Self-pay | Admitting: *Deleted

## 2016-12-06 NOTE — Telephone Encounter (Signed)
Patient left a msg on the refill vm requesting that Dr End refill the cipro and flagyl that he was prescribed a few months ago in the ED for diverticulitis. He states that he does not want to have to go to the ED again. Patient can be reached at [phone]. Please advise. Thanks, MI

## 2016-12-06 NOTE — Telephone Encounter (Signed)
Dr End will not be able to refill, I left a message for pt to call back to discuss.

## 2016-12-06 NOTE — Telephone Encounter (Signed)
Pt states he thinks he is having a flare of diverticulitis and is requesting refills of Cipro and Flagyl that had been prescribed for him by ED in April 2018. Pt aware Dr End would not be able to refill, advised pt to contact PCP for recommendations.

## 2016-12-30 ENCOUNTER — Other Ambulatory Visit: Payer: Self-pay

## 2016-12-30 DIAGNOSIS — I10 Essential (primary) hypertension: Secondary | ICD-10-CM

## 2016-12-30 MED ORDER — ROSUVASTATIN CALCIUM 5 MG PO TABS: 5.0000 mg | ORAL_TABLET | Freq: Every day | ORAL | 1 refills | Status: DC

## 2017-01-09 ENCOUNTER — Other Ambulatory Visit: Payer: Self-pay | Admitting: Internal Medicine

## 2017-01-09 MED ORDER — CARVEDILOL 6.25 MG PO TABS: 6.2500 mg | ORAL_TABLET | Freq: Two times a day (BID) | ORAL | 9 refills | Status: DC

## 2017-05-06 DIAGNOSIS — I428 Other cardiomyopathies: Secondary | ICD-10-CM | POA: Insufficient documentation

## 2017-05-06 DIAGNOSIS — I2510 Atherosclerotic heart disease of native coronary artery without angina pectoris: Secondary | ICD-10-CM | POA: Insufficient documentation

## 2017-05-06 DIAGNOSIS — M109 Gout, unspecified: Secondary | ICD-10-CM | POA: Insufficient documentation

## 2017-05-19 ENCOUNTER — Ambulatory Visit: Payer: BLUE CROSS/BLUE SHIELD | Admitting: Internal Medicine

## 2017-05-19 ENCOUNTER — Encounter: Payer: Self-pay | Admitting: Internal Medicine

## 2017-05-19 VITALS — BP 138/98 | HR 93 | Ht 70.0 in | Wt 271.8 lb

## 2017-05-19 DIAGNOSIS — I10 Essential (primary) hypertension: Secondary | ICD-10-CM | POA: Diagnosis not present

## 2017-05-19 DIAGNOSIS — I2510 Atherosclerotic heart disease of native coronary artery without angina pectoris: Secondary | ICD-10-CM | POA: Diagnosis not present

## 2017-05-19 DIAGNOSIS — I428 Other cardiomyopathies: Secondary | ICD-10-CM | POA: Diagnosis not present

## 2017-05-19 MED ORDER — CARVEDILOL 12.5 MG PO TABS: 12.5000 mg | ORAL_TABLET | Freq: Two times a day (BID) | ORAL | 1 refills | Status: DC

## 2017-05-19 NOTE — Patient Instructions (Signed)
Medication Instructions:  Increase coreg (carvedilol) to 12.5 mg two times a day. You can take 2 of your 6.25mg  tablets two times a day and use your current supply.  Labwork: None   Testing/Procedures: None  Follow-Up: Your physician wants you to follow-up in: 6 months with Dr End. (June 2019). You will receive a reminder letter in the mail two months in advance. If you don't receive a letter, please call our office to schedule the follow-up appointment.        If you need a refill on your cardiac medications before your next appointment, please call your pharmacy.

## 2017-05-19 NOTE — Progress Notes (Signed)
Follow-up Outpatient Visit Date: 05/19/2017  Primary Care Provider: Patient, No Pcp Per No address on file  Chief Complaint: Follow-up hypertension  HPI:  Mr. Ratliff is a 56 y.o. year-old male with history of nonobstructive coronary artery disease, nonischemic cardiomyopathy, hypertension, and gout, who presents for follow-up of hypertension. I last saw Mr. Stater in May, at which time he had made significant lifestyle changes. His blood pressure was well-controlled time on carvedilol 6.25 mg twice a day and losartan 100 mg daily. Today, Mr. Majewski reports that he continues to feel well, denies chest pain, shortness of breath, palpitations, and lightheadedness. Unfortunately, he has gained back the weight that he had lost over the spring due to poor diet and lack of exercise after starting a new job. He is committed to losing weight again is now back at the gym. Home blood pressures are typically around 130/90. He has not had any side effects from carvedilol and losartan, though he questions if losartan is included in the recalled medications.  --------------------------------------------------------------------------------------------------  Cardiovascular History & Procedures: Cardiovascular Problems:  Non-obstructive CAD  Non-ischemic cardiomyopathy  Uncontrolled hypertension  Risk Factors:  Known coronary artery disease, hypertension, impaired glucose tolerance, obesity, and male gender  Cath/PCI:  None  CV Surgery:  None  EP Procedures and Devices:  None  Non-Invasive Evaluation(s):  Exercise myocardial perfusion stress test (06/27/16): Low risk study without ischemia or scar. LVEF 40%.  Transthoracic echocardiogram (06/18/16): Normal LV size with mild LVH. LVEF 40-45% with grade 1 diastolic dysfunction. Mild left atrial enlargement. Normal RV size and function. No significant valvular abnormalities.  Coronary CTA (03/14/12): LMCA normal. LAD with eccentric calcified  plaque at the ostium without any associated luminal irregularities. No significant disease involving the ramus LCx, or RCA. Calcium score 55 Agatston units.Aortic root upper normal limits of size.  Recent CV Pertinent Labs: Lab Results  Component Value Date   CHOL 97 (L) 10/17/2016   HDL 40 10/17/2016   LDLCALC 47 10/17/2016   LDLDIRECT 156.7 04/08/2012   TRIG 52 10/17/2016   CHOLHDL 2.4 10/17/2016   CHOLHDL 4.5 05/27/2016   INR 0.98 03/13/2012   K 5.0 10/06/2016   BUN 14 10/06/2016   BUN 12 07/25/2016   CREATININE 0.94 10/06/2016   CREATININE 0.89 05/27/2016    Past medical and surgical history were reviewed and updated in EPIC.  Current Meds  Medication Sig  . aspirin EC 81 MG tablet Take 1 tablet (81 mg total) by mouth daily.  . colchicine 0.6 MG tablet Take 1 tablet (0.6 mg total) by mouth daily.  Marland Kitchen losartan (COZAAR) 100 MG tablet Take 1 tablet (100 mg total) by mouth daily.  . Multiple Vitamin (MULTIVITAMIN WITH MINERALS) TABS Take 1 tablet by mouth daily.  . rosuvastatin (CRESTOR) 5 MG tablet Take 1 tablet (5 mg total) by mouth daily.  . [DISCONTINUED] carvedilol (COREG) 6.25 MG tablet Take 1 tablet (6.25 mg total) by mouth 2 (two) times daily with a meal.    Allergies: Patient has no known allergies.  Social History   Socioeconomic History  . Marital status: Married    Spouse name: Not on file  . Number of children: Not on file  . Years of education: Not on file  . Highest education level: Not on file  Social Needs  . Financial resource strain: Not on file  . Food insecurity - worry: Not on file  . Food insecurity - inability: Not on file  . Transportation needs - medical: Not on  file  . Transportation needs - non-medical: Not on file  Occupational History  . Not on file  Tobacco Use  . Smoking status: Former Smoker    Packs/day: 1.00    Years: 25.00    Pack years: 25.00    Last attempt to quit: 2010    Years since quitting: 8.9  . Smokeless tobacco:  Never Used  Substance and Sexual Activity  . Alcohol use: No  . Drug use: No  . Sexual activity: Not on file  Other Topics Concern  . Not on file  Social History Narrative  . Not on file    Family History  Adopted: Yes    Review of Systems: A 12-system review of systems was performed and was negative except as noted in the HPI.  --------------------------------------------------------------------------------------------------  Physical Exam: BP (!) 138/98   Pulse 93   Ht 5\' 10"  (1.778 m)   Wt 271 lb 12.8 oz (123.3 kg)   BMI 39.00 kg/m   General:  Obese man, see comfortably in the exam room. HEENT: No conjunctival pallor or scleral icterus. Moist mucous membranes.  OP clear. Neck: Supple without lymphadenopathy, thyromegaly, JVD, or HJR. Lungs: Normal work of breathing. Clear to auscultation bilaterally without wheezes or crackles. Heart: Regular rate and rhythm without murmurs, rubs, or gallops. Unable to assess PMI due to body habitus. Abd: Bowel sounds present. Soft, NT/ND. Unable to assess HSM due to body habitus. Ext: No lower extremity edema. Radial, PT, and DP pulses are 2+ bilaterally. Skin: Warm and dry without rash.  EKG:  Normal sinus rhythm with right bundle branch block. No significant change from prior tracing in 05/2016.  Lab Results  Component Value Date   WBC 9.6 10/06/2016   HGB 15.1 10/06/2016   HCT 45.2 10/06/2016   MCV 87.9 10/06/2016   PLT 319 10/06/2016    Lab Results  Component Value Date   NA 136 10/06/2016   K 5.0 10/06/2016   CL 101 10/06/2016   CO2 25 10/06/2016   BUN 14 10/06/2016   CREATININE 0.94 10/06/2016   GLUCOSE 111 (H) 10/06/2016   ALT 37 10/17/2016    Lab Results  Component Value Date   CHOL 97 (L) 10/17/2016   HDL 40 10/17/2016   LDLCALC 47 10/17/2016   LDLDIRECT 156.7 04/08/2012   TRIG 52 10/17/2016   CHOLHDL 2.4 10/17/2016     --------------------------------------------------------------------------------------------------  ASSESSMENT AND PLAN: Essential hypertension Blood pressure is mildly elevated today. I suspect some of this is due to recent weight gain. I have encouraged Mr. Yetta BarreJones to continue exercising and losing weight. We discussed the importance of a low sodium diet. We will continue with losartan 100 mg daily and increase carvedilol to 12.5 mg twice a day. I have asked Mr. Yetta BarreJones to keep checking his blood pressure at home and to let us know if it is consistently above 140/90. I have confirmed with Mr. Yetta BarreJones and our pharmacy team that his losartan is not under recall.  Nonischemic cardiomyopathy No evidence of decompensated heart failure. We will continue with losartan and carvedilol, as above.  Coronary artery disease No symptoms to suggest worsening coronary insufficiency. Mild plaque noted by coronary CTA in 2013. We will continue with statin therapy. Most recent LDL was excellent in May.  Return to clinic in 6 months.  Yvonne Kendallhristopher Lelon Ikard, MD 05/19/2017 1:54 PM

## 2017-06-16 ENCOUNTER — Other Ambulatory Visit: Payer: Self-pay | Admitting: Internal Medicine

## 2017-06-16 DIAGNOSIS — I10 Essential (primary) hypertension: Secondary | ICD-10-CM

## 2017-09-09 ENCOUNTER — Other Ambulatory Visit: Payer: Self-pay | Admitting: Internal Medicine

## 2017-09-09 DIAGNOSIS — I10 Essential (primary) hypertension: Secondary | ICD-10-CM

## 2017-09-09 NOTE — Telephone Encounter (Signed)
Please review for refill, Thanks !  

## 2017-11-06 ENCOUNTER — Other Ambulatory Visit: Payer: Self-pay | Admitting: Internal Medicine

## 2017-11-06 NOTE — Telephone Encounter (Signed)
Refill Request.  

## 2017-11-20 ENCOUNTER — Ambulatory Visit: Payer: BLUE CROSS/BLUE SHIELD | Admitting: Internal Medicine

## 2017-12-11 ENCOUNTER — Other Ambulatory Visit: Payer: Self-pay | Admitting: Internal Medicine

## 2017-12-11 DIAGNOSIS — I10 Essential (primary) hypertension: Secondary | ICD-10-CM

## 2017-12-11 NOTE — Telephone Encounter (Signed)
Refill Request.  

## 2018-03-09 ENCOUNTER — Other Ambulatory Visit: Payer: Self-pay | Admitting: Internal Medicine

## 2018-03-09 DIAGNOSIS — I10 Essential (primary) hypertension: Secondary | ICD-10-CM

## 2018-03-09 NOTE — Telephone Encounter (Signed)
Refill Request.  

## 2018-03-13 ENCOUNTER — Encounter: Payer: Self-pay | Admitting: Internal Medicine

## 2018-03-13 ENCOUNTER — Ambulatory Visit: Payer: BLUE CROSS/BLUE SHIELD | Admitting: Internal Medicine

## 2018-03-13 VITALS — BP 140/98 | HR 72 | Ht 70.0 in | Wt 284.0 lb

## 2018-03-13 DIAGNOSIS — E785 Hyperlipidemia, unspecified: Secondary | ICD-10-CM | POA: Diagnosis not present

## 2018-03-13 DIAGNOSIS — I10 Essential (primary) hypertension: Secondary | ICD-10-CM | POA: Diagnosis not present

## 2018-03-13 DIAGNOSIS — M109 Gout, unspecified: Secondary | ICD-10-CM

## 2018-03-13 DIAGNOSIS — I428 Other cardiomyopathies: Secondary | ICD-10-CM

## 2018-03-13 MED ORDER — COLCHICINE 0.6 MG PO TABS: 0.6000 mg | ORAL_TABLET | Freq: Every day | ORAL | 0 refills | Status: DC

## 2018-03-13 NOTE — Progress Notes (Addendum)
Follow-up Outpatient Visit Date: 03/13/2018  Primary Care Provider: Patient, No Pcp Per No address on file  Chief Complaint: Follow-up hypertension and nonischemic cardiomyopathy  HPI:  Todd Meyer is a 57 y.o. year-old male with history of non-obstructive coronary artery disease, nonischemic cardiomyopathy, hypertension, and gout, who presents for follow-up of hypertension and cardiomyopathy.  I last saw Todd Meyer in December, at which time he was feeling well.  He had put on much of the weight that he had previously lost but was in the process of trying to change his diet and increase his exercise again.  Today, Todd Meyer reports feeling well.  He notes that he has gained weight since our last visit, though he believes that he has started losing weight again over the last month or two.  He attributes his weight gain to dietary indiscretion and less activity.  He has been compliant with his medications and reports that his blood pressure has typically been well controlled at home.  He denies chest pain, shortness of breath, palpitations, lightheadedness, orthopnea, PND, and edema.  He snores but generally sleeps well and is well rested in the morning.  Todd Meyer has continued to have intermittent joint pains predominantly in his hands, which improves with colchicine.  He asked for refill today.  He also has noted intermittent burning in his epigastrum and chest, usually after eating and when lying flat.  --------------------------------------------------------------------------------------------------  Cardiovascular History & Procedures: Cardiovascular Problems:  Non-obstructive CAD  Non-ischemic cardiomyopathy  Uncontrolled hypertension  Risk Factors:  Known coronary artery disease, hypertension, impaired glucose tolerance, obesity, and male gender  Cath/PCI:  None  CV Surgery:  None  EP Procedures and Devices:  None  Non-Invasive Evaluation(s):  Exercise  myocardial perfusion stress test (06/27/16): Low risk study without ischemia or scar. LVEF 40%.  Transthoracic echocardiogram (06/18/16): Normal LV size with mild LVH. LVEF 40-45% with grade 1 diastolic dysfunction. Mild left atrial enlargement. Normal RV size and function. No significant valvular abnormalities.  Coronary CTA (03/14/12): LMCA normal. LAD with eccentric calcified plaque at the ostium without any associated luminal irregularities. No significant disease involving the ramus LCx, or RCA. Calcium score 55 Agatston units.Aortic root upper normal limits of size.  Recent CV Pertinent Labs: Lab Results  Component Value Date   CHOL 97 (L) 10/17/2016   HDL 40 10/17/2016   LDLCALC 47 10/17/2016   LDLDIRECT 156.7 04/08/2012   TRIG 52 10/17/2016   CHOLHDL 2.4 10/17/2016   CHOLHDL 4.5 05/27/2016   INR 0.98 03/13/2012   K 5.0 10/06/2016   BUN 14 10/06/2016   BUN 12 07/25/2016   CREATININE 0.94 10/06/2016   CREATININE 0.89 05/27/2016    Past medical and surgical history were reviewed and updated in EPIC.  Current Meds  Medication Sig  . aspirin EC 81 MG tablet Take 1 tablet (81 mg total) by mouth daily.  . carvedilol (COREG) 12.5 MG tablet TAKE 1 TABLET(12.5 MG) BY MOUTH TWICE DAILY  . colchicine 0.6 MG tablet Take 1 tablet (0.6 mg total) by mouth daily.  Marland Kitchen losartan (COZAAR) 100 MG tablet Take 1 tablet (100 mg total) by mouth daily. Please keep upcoming appt for future refills. Thank you  . Multiple Vitamin (MULTIVITAMIN WITH MINERALS) TABS Take 1 tablet by mouth daily.  . rosuvastatin (CRESTOR) 5 MG tablet TAKE 1 TABLET(5 MG) BY MOUTH DAILY  . [DISCONTINUED] colchicine 0.6 MG tablet Take 1 tablet (0.6 mg total) by mouth daily.    Allergies: Patient has no known  allergies.  Social History   Tobacco Use  . Smoking status: Former Smoker    Packs/day: 1.00    Years: 25.00    Pack years: 25.00    Last attempt to quit: 2010    Years since quitting: 9.7  . Smokeless tobacco:  Never Used  Substance Use Topics  . Alcohol use: No  . Drug use: No    Family History  Adopted: Yes    Review of Systems: A 12-system review of systems was performed and was negative except as noted in the HPI.  --------------------------------------------------------------------------------------------------  Physical Exam: BP (!) 140/98   Pulse 72   Ht 5\' 10"  (1.778 m)   Wt 284 lb (128.8 kg)   SpO2 91%   BMI 40.75 kg/m   General: NAD. HEENT: No conjunctival pallor or scleral icterus. Moist mucous membranes.  OP clear. Neck: Supple without lymphadenopathy, thyromegaly, JVD, or HJR.  Though evaluation is limited by body habitus. Lungs: Normal work of breathing. Clear to auscultation bilaterally without wheezes or crackles. Heart: Regular rate and rhythm without murmurs, rubs, or gallops.  Unable to assess PMI due to body habitus. Abd: Bowel sounds present. Soft, NT/ND.  Unable to assess HSM due to body habitus. Ext: No lower extremity edema. Radial, PT, and DP pulses are 2+ bilaterally. Skin: Warm and dry without rash.  EKG: Normal sinus rhythm with fusion complexes and right bundle branch block.  No significant change since 05/19/2017.  Lab Results  Component Value Date   WBC 9.6 10/06/2016   HGB 15.1 10/06/2016   HCT 45.2 10/06/2016   MCV 87.9 10/06/2016   PLT 319 10/06/2016    Lab Results  Component Value Date   NA 136 10/06/2016   K 5.0 10/06/2016   CL 101 10/06/2016   CO2 25 10/06/2016   BUN 14 10/06/2016   CREATININE 0.94 10/06/2016   GLUCOSE 111 (H) 10/06/2016   ALT 37 10/17/2016    Lab Results  Component Value Date   CHOL 97 (L) 10/17/2016   HDL 40 10/17/2016   LDLCALC 47 10/17/2016   LDLDIRECT 156.7 04/08/2012   TRIG 52 10/17/2016   CHOLHDL 2.4 10/17/2016    --------------------------------------------------------------------------------------------------  ASSESSMENT AND PLAN: Essential hypertension Blood pressure poorly controlled, though  on recheck today it is somewhat better at 140/98.  We will continue current doses of carvedilol and losartan.  I encouraged weight loss and sodium restriction.  We discussed sleep study referral but have agreed to defer at this in favor of weight loss.  Nonischemic cardiomyopathy Todd Meyer appears euvolemic and well compensated with NYHA class I symptoms.  I encouraged sodium restriction and weight loss.  I will check a CBC, CMP, and lipid panel today.  Morbid obesity Unfortunately, Todd Meyer has put on quite a bit of weight over the last year.  I encouraged him to begin exercising and to diet again.  Information about the DASH diet was provided today.  Hyperlipidemia Todd Meyer is tolerating rosuvastatin well.  I will check a CMP and lipid panel today.  GERD Chest burning after eating and when lying flat most consistent with GERD.  I have recommended empiric trial of famotidine 20 mg twice daily as well as as needed Tums.  Gout No symptoms currently.  I will provide Todd Meyer with a one-time prescription for colchicine to be used as needed.  I advised him to establish long-term follow-up with a primary care provider.  Follow-up: Return to hypertension clinic in 6 weeks.  He  will also follow-up with me in [place] in 6 months.  Yvonne Kendall, MD 03/13/2018 6:48 PM

## 2018-03-13 NOTE — Patient Instructions (Addendum)
Medication Instructions:  Your physician recommends that you continue on your current medications as directed. Please refer to the Current Medication list given to you today.  -- If you need a refill on your cardiac medications before your next appointment, please call your pharmacy. --  Labwork: None ordered  Testing/Procedures: None ordered  Follow-Up: HTN CLINIC 6 WEEKS  Your physician wants you to follow-up in: 6 MONTHS with Dr. Okey Dupre IN [place].   PLEASE give a list of PCPs in the area.   You will receive a reminder letter in the mail two months in advance. If you don't receive a letter, please call our office to schedule the follow-up appointment.  Thank you for choosing [facility]!!    Any Other Special Instructions Will Be Listed Below (If Applicable).  DASH Eating Plan DASH stands for "Dietary Approaches to Stop Hypertension." The DASH eating plan is a healthy eating plan that has been shown to reduce high blood pressure (hypertension). It may also reduce your risk for type 2 diabetes, heart disease, and stroke. The DASH eating plan may also help with weight loss. What are tips for following this plan? General guidelines  Avoid eating more than 2,300 mg (milligrams) of salt (sodium) a day. If you have hypertension, you may need to reduce your sodium intake to 1,500 mg a day.  Limit alcohol intake to no more than 1 drink a day for nonpregnant women and 2 drinks a day for men. One drink equals 12 oz of beer, 5 oz of wine, or 1 oz of hard liquor.  Work with your health care provider to maintain a healthy body weight or to lose weight. Ask what an ideal weight is for you.  Get at least 30 minutes of exercise that causes your heart to beat faster (aerobic exercise) most days of the week. Activities may include walking, swimming, or biking.  Work with your health care provider or diet and nutrition specialist (dietitian) to adjust your eating plan to your individual  calorie needs. Reading food labels  Check food labels for the amount of sodium per serving. Choose foods with less than 5 percent of the Daily Value of sodium. Generally, foods with less than 300 mg of sodium per serving fit into this eating plan.  To find whole grains, look for the word "whole" as the first word in the ingredient list. Shopping  Buy products labeled as "low-sodium" or "no salt added."  Buy fresh foods. Avoid canned foods and premade or frozen meals. Cooking  Avoid adding salt when cooking. Use salt-free seasonings or herbs instead of table salt or sea salt. Check with your health care provider or pharmacist before using salt substitutes.  Do not fry foods. Cook foods using healthy methods such as baking, boiling, grilling, and broiling instead.  Cook with heart-healthy oils, such as olive, canola, soybean, or sunflower oil. Meal planning   Eat a balanced diet that includes: ? 5 or more servings of fruits and vegetables each day. At each meal, try to fill half of your plate with fruits and vegetables. ? Up to 6-8 servings of whole grains each day. ? Less than 6 oz of lean meat, poultry, or fish each day. A 3-oz serving of meat is about the same size as a deck of cards. One egg equals 1 oz. ? 2 servings of low-fat dairy each day. ? A serving of nuts, seeds, or beans 5 times each week. ? Heart-healthy fats. Healthy fats called Omega-3 fatty acids  are found in foods such as flaxseeds and coldwater fish, like sardines, salmon, and mackerel.  Limit how much you eat of the following: ? Canned or prepackaged foods. ? Food that is high in trans fat, such as fried foods. ? Food that is high in saturated fat, such as fatty meat. ? Sweets, desserts, sugary drinks, and other foods with added sugar. ? Full-fat dairy products.  Do not salt foods before eating.  Try to eat at least 2 vegetarian meals each week.  Eat more home-cooked food and less restaurant, buffet, and fast  food.  When eating at a restaurant, ask that your food be prepared with less salt or no salt, if possible. What foods are recommended? The items listed may not be a complete list. Talk with your dietitian about what dietary choices are best for you. Grains Whole-grain or whole-wheat bread. Whole-grain or whole-wheat pasta. Brown rice. Modena Morrow. Bulgur. Whole-grain and low-sodium cereals. Pita bread. Low-fat, low-sodium crackers. Whole-wheat flour tortillas. Vegetables Fresh or frozen vegetables (raw, steamed, roasted, or grilled). Low-sodium or reduced-sodium tomato and vegetable juice. Low-sodium or reduced-sodium tomato sauce and tomato paste. Low-sodium or reduced-sodium canned vegetables. Fruits All fresh, dried, or frozen fruit. Canned fruit in natural juice (without added sugar). Meat and other protein foods Skinless chicken or [place]. Ground chicken or [place]. Pork with fat trimmed off. Fish and seafood. Egg whites. Dried beans, peas, or lentils. Unsalted nuts, nut butters, and seeds. Unsalted canned beans. Lean cuts of beef with fat trimmed off. Low-sodium, lean deli meat. Dairy Low-fat (1%) or fat-free (skim) milk. Fat-free, low-fat, or reduced-fat cheeses. Nonfat, low-sodium ricotta or cottage cheese. Low-fat or nonfat yogurt. Low-fat, low-sodium cheese. Fats and oils Soft margarine without trans fats. Vegetable oil. Low-fat, reduced-fat, or light mayonnaise and salad dressings (reduced-sodium). Canola, safflower, olive, soybean, and sunflower oils. Avocado. Seasoning and other foods Herbs. Spices. Seasoning mixes without salt. Unsalted popcorn and pretzels. Fat-free sweets. What foods are not recommended? The items listed may not be a complete list. Talk with your dietitian about what dietary choices are best for you. Grains Baked goods made with fat, such as croissants, muffins, or some breads. Dry pasta or rice meal packs. Vegetables Creamed or fried vegetables. Vegetables  in a cheese sauce. Regular canned vegetables (not low-sodium or reduced-sodium). Regular canned tomato sauce and paste (not low-sodium or reduced-sodium). Regular tomato and vegetable juice (not low-sodium or reduced-sodium). Angie Fava. Olives. Fruits Canned fruit in a light or heavy syrup. Fried fruit. Fruit in cream or butter sauce. Meat and other protein foods Fatty cuts of meat. Ribs. Fried meat. Berniece Salines. Sausage. Bologna and other processed lunch meats. Salami. Fatback. Hotdogs. Bratwurst. Salted nuts and seeds. Canned beans with added salt. Canned or smoked fish. Whole eggs or egg yolks. Chicken or [place] with skin. Dairy Whole or 2% milk, cream, and half-and-half. Whole or full-fat cream cheese. Whole-fat or sweetened yogurt. Full-fat cheese. Nondairy creamers. Whipped toppings. Processed cheese and cheese spreads. Fats and oils Butter. Stick margarine. Lard. Shortening. Ghee. Bacon fat. Tropical oils, such as coconut, palm kernel, or palm oil. Seasoning and other foods Salted popcorn and pretzels. Onion salt, garlic salt, seasoned salt, table salt, and sea salt. Worcestershire sauce. Tartar sauce. Barbecue sauce. Teriyaki sauce. Soy sauce, including reduced-sodium. Steak sauce. Canned and packaged gravies. Fish sauce. Oyster sauce. Cocktail sauce. Horseradish that you find on the shelf. Ketchup. Mustard. Meat flavorings and tenderizers. Bouillon cubes. Hot sauce and Tabasco sauce. Premade or packaged marinades. Premade or packaged taco  seasonings. Relishes. Regular salad dressings. Where to find more information:  National Heart, Lung, and Blood Institute: PopSteam.is  American Heart Association: www.heart.org Summary  The DASH eating plan is a healthy eating plan that has been shown to reduce high blood pressure (hypertension). It may also reduce your risk for type 2 diabetes, heart disease, and stroke.  With the DASH eating plan, you should limit salt (sodium) intake to 2,300 mg a  day. If you have hypertension, you may need to reduce your sodium intake to 1,500 mg a day.  When on the DASH eating plan, aim to eat more fresh fruits and vegetables, whole grains, lean proteins, low-fat dairy, and heart-healthy fats.  Work with your health care provider or diet and nutrition specialist (dietitian) to adjust your eating plan to your individual calorie needs. This information is not intended to replace advice given to you by your health care provider. Make sure you discuss any questions you have with your health care provider. Document Released: 05/23/2011 Document Revised: 05/27/2016 Document Reviewed: 05/27/2016 Elsevier Interactive Patient Education  [employer].

## 2018-03-14 LAB — COMPREHENSIVE METABOLIC PANEL
A/G RATIO: 1.5 (ref 1.2–2.2)
ALT: 23 IU/L (ref 0–44)
AST: 17 IU/L (ref 0–40)
Albumin: 4.3 g/dL (ref 3.5–5.5)
Alkaline Phosphatase: 75 IU/L (ref 39–117)
BUN/Creatinine Ratio: 11 (ref 9–20)
BUN: 11 mg/dL (ref 6–24)
Bilirubin Total: 0.6 mg/dL (ref 0.0–1.2)
CALCIUM: 9.9 mg/dL (ref 8.7–10.2)
CO2: 24 mmol/L (ref 20–29)
CREATININE: 0.96 mg/dL (ref 0.76–1.27)
Chloride: 106 mmol/L (ref 96–106)
GFR, EST AFRICAN AMERICAN: 102 mL/min/{1.73_m2} (ref >59)
GFR, EST NON AFRICAN AMERICAN: 88 mL/min/{1.73_m2} (ref >59)
GLUCOSE: 91 mg/dL (ref 65–99)
Globulin, Total: 2.9 g/dL (ref 1.5–4.5)
POTASSIUM: 4.2 mmol/L (ref 3.5–5.2)
Sodium: 144 mmol/L (ref 134–144)
TOTAL PROTEIN: 7.2 g/dL (ref 6.0–8.5)

## 2018-03-14 LAB — LIPID PANEL
CHOL/HDL RATIO: 3.1 ratio (ref 0.0–5.0)
Cholesterol, Total: 143 mg/dL (ref 100–199)
HDL: 46 mg/dL (ref >39)
LDL CALC: 82 mg/dL (ref 0–99)
Triglycerides: 76 mg/dL (ref 0–149)
VLDL CHOLESTEROL CAL: 15 mg/dL (ref 5–40)

## 2018-03-14 LAB — CBC
HEMOGLOBIN: 14.9 g/dL (ref 13.0–17.7)
Hematocrit: 46 % (ref 37.5–51.0)
MCH: 29.6 pg (ref 26.6–33.0)
MCHC: 32.4 g/dL (ref 31.5–35.7)
MCV: 91 fL (ref 79–97)
PLATELETS: 261 10*3/uL (ref 150–450)
RBC: 5.04 x10E6/uL (ref 4.14–5.80)
RDW: 14.1 % (ref 12.3–15.4)
WBC: 7.5 10*3/uL (ref 3.4–10.8)

## 2018-03-16 ENCOUNTER — Other Ambulatory Visit: Payer: Self-pay

## 2018-03-16 ENCOUNTER — Telehealth: Payer: Self-pay

## 2018-03-16 MED ORDER — MITIGARE 0.6 MG PO CAPS: 0.6000 mg | ORAL_CAPSULE | Freq: Every day | ORAL | 1 refills | Status: AC

## 2018-03-16 NOTE — Telephone Encounter (Addendum)
**Note De-Identified [employer] Obfuscation** I received a PA request on the pts Colchicine. I changed the RX to Mitigare (brand name) and then called [facility] pharmacy and was advised that Mitigare went through without needing a Pa.

## 2018-04-23 ENCOUNTER — Ambulatory Visit (INDEPENDENT_AMBULATORY_CARE_PROVIDER_SITE_OTHER): Payer: BLUE CROSS/BLUE SHIELD | Admitting: Pharmacist

## 2018-04-23 ENCOUNTER — Encounter: Payer: Self-pay | Admitting: Pharmacist

## 2018-04-23 VITALS — BP 152/102 | HR 84

## 2018-04-23 DIAGNOSIS — I10 Essential (primary) hypertension: Secondary | ICD-10-CM

## 2018-04-23 NOTE — Patient Instructions (Addendum)
RETURN for a follow up visit after the New Year.   BRING your blood pressure cuff and log with you when you come for verification.  Please check your blood pressure 3-4 times a week at least during this time.   Your target blood pressure is less than 130/80.   Continue all medications as prescribed.

## 2018-04-23 NOTE — Progress Notes (Signed)
Patient ID: Todd Meyer                 DOB: Nov 11, 1960                      MRN: [id number]     HPI: Todd Meyer is a 57 y.o. male patient of Dr. Okey Dupre who presents today for hypertension follow up. PMH significant for non-obstructive coronary artery disease, nonischemic cardiomyopathy, hypertension, and gout. At his most recent his medications were continued and sodium restriction was discussed.   He presents today in good spirits. He reports that when he uses his wife's cuff at home his blood pressure is "good." He believes that when he comes to doctor's offices his pressure run much higher because he is nervous.   He states that overall he is doing well. He states that he did have an episode of dizziness a few days back.   He states that he is going to get back in the gym and start exercising regularly.   Current HTN meds:  Losartan 100mg  daily in the morning Carvedilol 12.5mg  BID  Previously tried: none  BP goal: <130/80  Family History: Unknown as he was adopted.   Social History: He quit smoking about 7 years ago. He also previously was a drinker but now is alcohol free.   Diet: He has stopped using salts and seasonings and eats most of his meals from home. He endorses one cup of coffee per day.   Exercise: He joined a gym over the holiday and plans to start using the elliptical today.   Home BP readings: 128-130 - pre report  Wt Readings from Last 3 Encounters:  03/13/18 284 lb (128.8 kg)  05/19/17 271 lb 12.8 oz (123.3 kg)  10/17/16 249 lb 1.9 oz (113 kg)   BP Readings from Last 3 Encounters:  04/23/18 (!) 152/102  03/13/18 (!) 140/98  05/19/17 (!) 138/98   Pulse Readings from Last 3 Encounters:  04/23/18 84  03/13/18 72  05/19/17 93    Renal function: CrCl cannot be calculated (Patient's most recent lab result is older than the maximum 21 days allowed.).  Past Medical History:  Diagnosis Date  . Chest pain   . Coronary artery disease   . Gout     . Hypertension   . Non-ischemic cardiomyopathy (HCC)     Current Outpatient Medications on File Prior to Visit  Medication Sig Dispense Refill  . aspirin EC 81 MG tablet Take 1 tablet (81 mg total) by mouth daily.    . carvedilol (COREG) 12.5 MG tablet TAKE 1 TABLET(12.5 MG) BY MOUTH TWICE DAILY 180 tablet 2  . losartan (COZAAR) 100 MG tablet Take 1 tablet (100 mg total) by mouth daily. Please keep upcoming appt for future refills. Thank you 90 tablet 0  . Multiple Vitamin (MULTIVITAMIN WITH MINERALS) TABS Take 1 tablet by mouth daily.    . rosuvastatin (CRESTOR) 5 MG tablet TAKE 1 TABLET(5 MG) BY MOUTH DAILY 90 tablet 2  . MITIGARE 0.6 MG CAPS Take 0.6 mg by mouth daily. (Patient not taking: Reported on 04/23/2018) 90 capsule 1   No current facility-administered medications on file prior to visit.     No Known Allergies  Blood pressure (!) 152/102, pulse 84, SpO2 96 %.   Assessment/Plan: Hypertension: BP is markedly elevated today. Discussed benefits of blood pressure control as well as diet and exercise to help with overall cardiovascular health. For now will continue  his current medication therapy as he reports pressures in the normal range at home. Have asked he and his wife work to keep a log of his pressures and bring this and his cuff to the office at follow up for verification. If cuff measures appropriately he could have white coat hypertension. If inappropriate measurement will plan to titrate carvedilol vs. Add therapy. 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    Thank you, Freddie Apley. Cleatis Polka, PharmD  [facility]  04/23/2018 9:54 PM

## 2018-04-30 ENCOUNTER — Other Ambulatory Visit: Payer: Self-pay | Admitting: Internal Medicine

## 2018-04-30 NOTE — Telephone Encounter (Signed)
carvedilol (COREG) 12.5 MG tablet  Medication  Date: 11/06/2017 Department: [facility] [address] Office Ordering/Authorizing: Yvonne Kendall, MD  Order Providers   Prescribing Provider Encounter Provider  End, Cristal Deer, MD End, Cristal Deer, MD  Outpatient Medication Detail    Disp Refills Start End   carvedilol (COREG) 12.5 MG tablet 180 tablet 2 11/06/2017    Sig: TAKE 1 TABLET(12.5 MG) BY MOUTH TWICE DAILY   Sent to pharmacy as: carvedilol (COREG) 12.5 MG tablet   E-Prescribing Status: Receipt confirmed by pharmacy (11/06/2017 4:11 PM EDT)   Pharmacy   [id number] - [place],  - [address] AT [facility] OF [address]  Additional Information   Associated Reports  View Encounter  Priority and Order Details

## 2018-04-30 NOTE — Telephone Encounter (Signed)
Please review for refill.  

## 2018-05-28 ENCOUNTER — Other Ambulatory Visit: Payer: Self-pay | Admitting: *Deleted

## 2018-05-28 DIAGNOSIS — I10 Essential (primary) hypertension: Secondary | ICD-10-CM

## 2018-05-28 MED ORDER — ROSUVASTATIN CALCIUM 5 MG PO TABS: ORAL_TABLET | ORAL | 2 refills | Status: AC

## 2018-05-28 MED ORDER — LOSARTAN POTASSIUM 100 MG PO TABS: 100.0000 mg | ORAL_TABLET | Freq: Every day | ORAL | 2 refills | Status: AC

## 2018-06-18 IMAGING — DX DG TOE GREAT 2+V*R*
3 series · 3 of 3 positions shown · non-contrast
Comparison: None.

CLINICAL DATA: Pain for 1 year.  Redness.

EXAM:
RIGHT GREAT TOE

[toe ap]
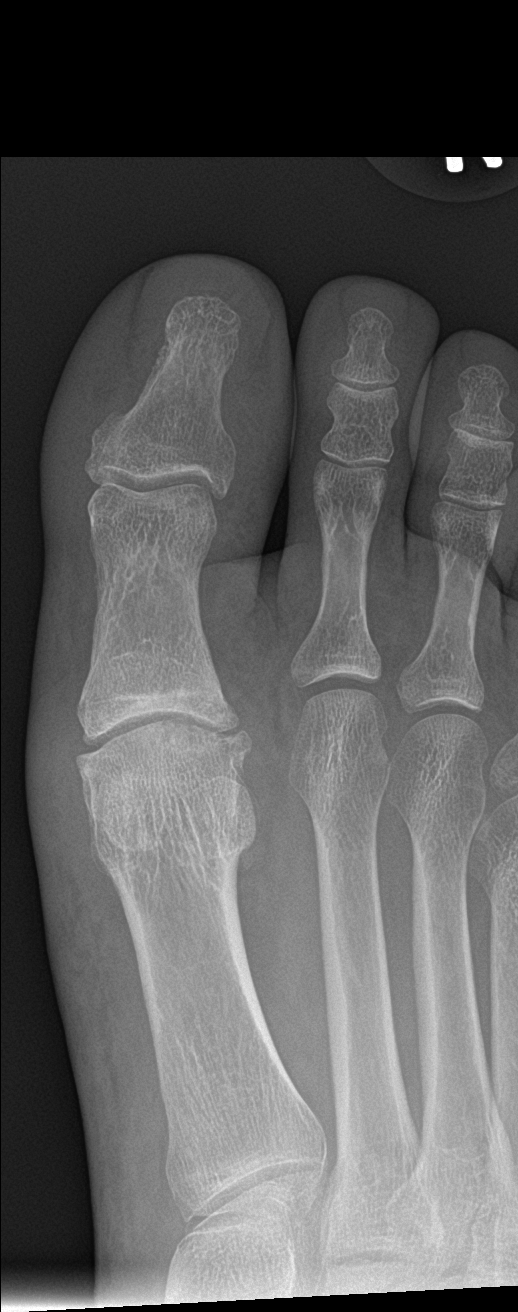

[toe obl]
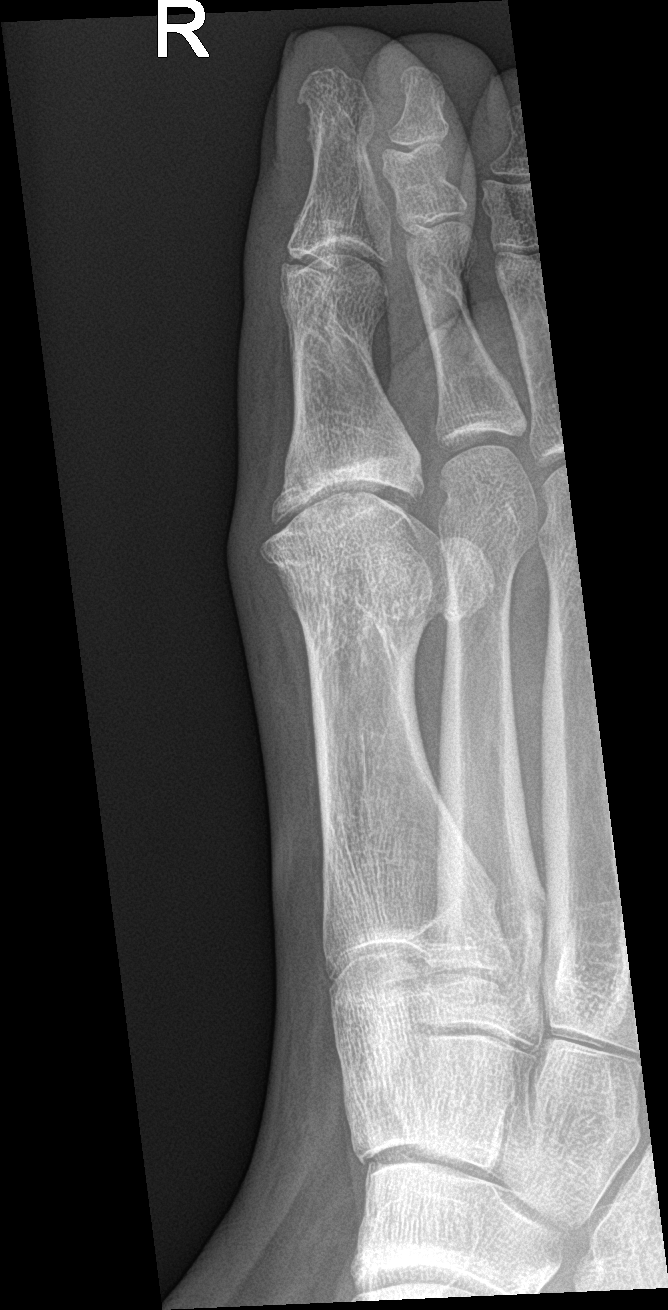

[toe lat]
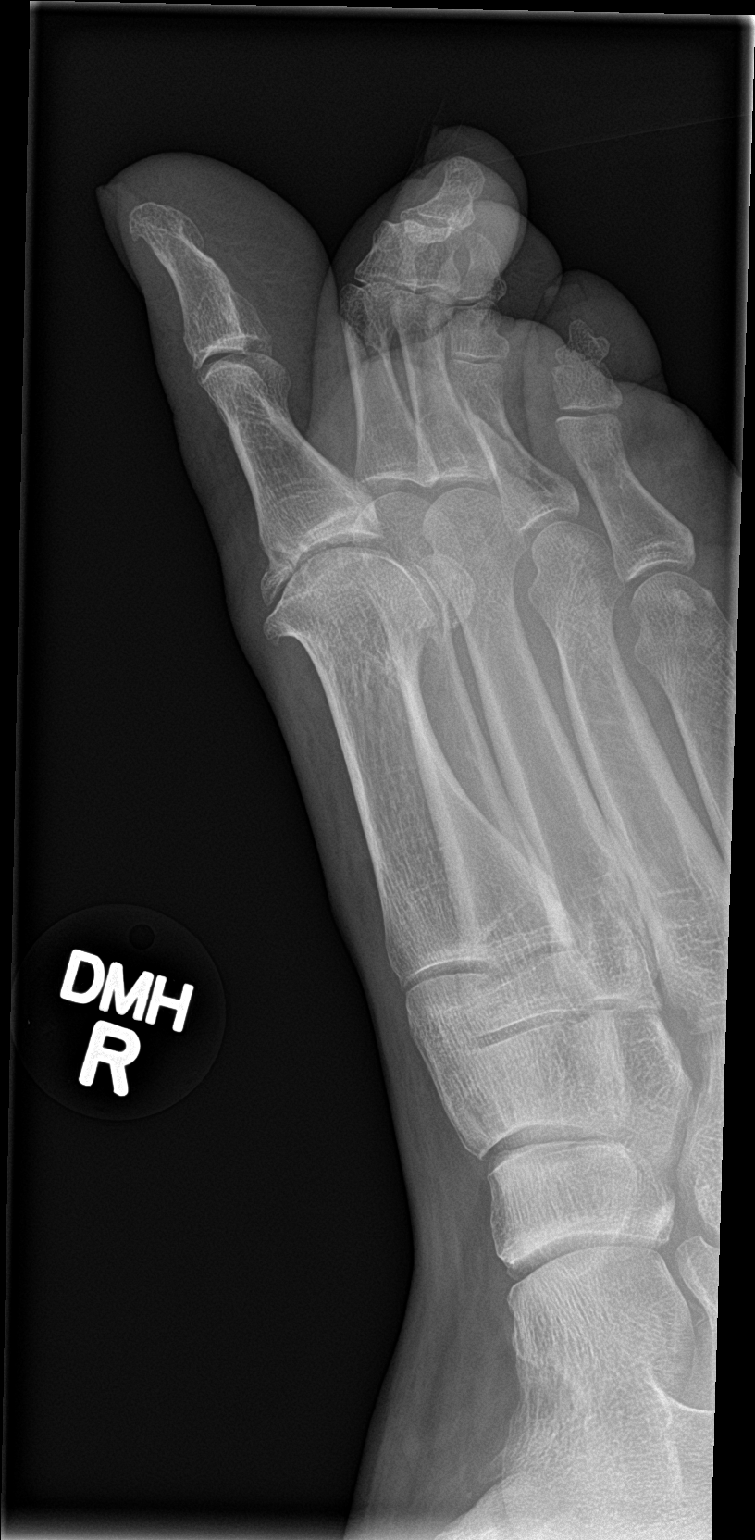

[3 of 3 positions shown; findings below may reference images not displayed]

FINDINGS: No fracture, dislocation, or bony erosion. Degenerative changes are
seen at the first MTP joint and the first interphalangeal joint.
IMPRESSION: Degenerative changes at the first MTP and interphalangeal joints. No
fractures. No bony erosion.

## 2018-06-30 ENCOUNTER — Ambulatory Visit: Payer: BLUE CROSS/BLUE SHIELD

## 2018-06-30 NOTE — Progress Notes (Deleted)
Patient ID: ARAN COWENS                 DOB: 1960-10-31                      MRN: [id number]     HPI: Todd Meyer is a 58 y.o. male patient of Dr. Okey Dupre who presents today for hypertension follow up. PMH significant for non-obstructive coronary artery disease, nonischemic cardiomyopathy, hypertension, and gout. At his most recent his blood pressure was markedly elevated but no medications were made as patient states his blood pressure was well controlled at home and he was nervous as the Dr. Isidore Moos. Patient was asked to bring his home log and blood pressure meter to next visit for calibration.  Still working out? Dizziness, light headedness, blurred vision falls  Current HTN meds:  Losartan 100mg  daily in the morning Carvedilol 12.5mg  BID  Previously tried: none  BP goal: <130/80  Family History: Unknown as he was adopted.   Social History: He quit smoking about 7 years ago. He also previously was a drinker but now is alcohol free.   Diet: He has stopped using salts and seasonings and eats most of his meals from home. He endorses one cup of coffee per day.   Exercise: He joined a gym over the holiday and plans to start using the elliptical today.   Home BP readings: 128-130 - pre report  Wt Readings from Last 3 Encounters:  03/13/18 284 lb (128.8 kg)  05/19/17 271 lb 12.8 oz (123.3 kg)  10/17/16 249 lb 1.9 oz (113 kg)   BP Readings from Last 3 Encounters:  04/23/18 (!) 152/102  03/13/18 (!) 140/98  05/19/17 (!) 138/98   Pulse Readings from Last 3 Encounters:  04/23/18 84  03/13/18 72  05/19/17 93    Renal function: CrCl cannot be calculated (Patient's most recent lab result is older than the maximum 21 days allowed.).  Past Medical History:  Diagnosis Date  . Chest pain   . Coronary artery disease   . Gout   . Hypertension   . Non-ischemic cardiomyopathy (HCC)     Current Outpatient Medications on File Prior to Visit  Medication Sig Dispense Refill  .  aspirin EC 81 MG tablet Take 1 tablet (81 mg total) by mouth daily.    . carvedilol (COREG) 12.5 MG tablet TAKE 1 TABLET(12.5 MG) BY MOUTH TWICE DAILY 180 tablet 2  . losartan (COZAAR) 100 MG tablet Take 1 tablet (100 mg total) by mouth daily. 90 tablet 2  . MITIGARE 0.6 MG CAPS Take 0.6 mg by mouth daily. (Patient not taking: Reported on 04/23/2018) 90 capsule 1  . Multiple Vitamin (MULTIVITAMIN WITH MINERALS) TABS Take 1 tablet by mouth daily.    . rosuvastatin (CRESTOR) 5 MG tablet TAKE 1 TABLET(5 MG) BY MOUTH DAILY 90 tablet 2   No current facility-administered medications on file prior to visit.     No Known Allergies  There were no vitals taken for this visit.   Assessment/Plan: Hypertension:   Thank you,  Olene Floss, Pharm.D, BCPS [facility]  [address] [place], [place] [address]  Phone: [phone]; Fax: [phone]   06/30/2018 12:57 PM

## 2018-07-01 ENCOUNTER — Emergency Department (HOSPITAL_COMMUNITY)
Admission: EM | Admit: 2018-07-01 | Discharge: 2018-07-01 | Disposition: A | Discharge status: Home / Self Care | Payer: Worker's Compensation | Attending: Emergency Medicine | Admitting: Emergency Medicine

## 2018-07-01 ENCOUNTER — Encounter (HOSPITAL_COMMUNITY): Payer: Self-pay

## 2018-07-01 ENCOUNTER — Other Ambulatory Visit: Payer: Self-pay

## 2018-07-01 DIAGNOSIS — S0990XA Unspecified injury of head, initial encounter: Secondary | ICD-10-CM | POA: Diagnosis not present

## 2018-07-01 DIAGNOSIS — Y990 Civilian activity done for income or pay: Secondary | ICD-10-CM | POA: Insufficient documentation

## 2018-07-01 DIAGNOSIS — Y9389 Activity, other specified: Secondary | ICD-10-CM | POA: Diagnosis not present

## 2018-07-01 DIAGNOSIS — Z79899 Other long term (current) drug therapy: Secondary | ICD-10-CM | POA: Insufficient documentation

## 2018-07-01 DIAGNOSIS — I10 Essential (primary) hypertension: Secondary | ICD-10-CM | POA: Insufficient documentation

## 2018-07-01 DIAGNOSIS — Z7982 Long term (current) use of aspirin: Secondary | ICD-10-CM | POA: Insufficient documentation

## 2018-07-01 DIAGNOSIS — Z87891 Personal history of nicotine dependence: Secondary | ICD-10-CM | POA: Diagnosis not present

## 2018-07-01 DIAGNOSIS — Y9289 Other specified places as the place of occurrence of the external cause: Secondary | ICD-10-CM | POA: Diagnosis not present

## 2018-07-01 DIAGNOSIS — I2510 Atherosclerotic heart disease of native coronary artery without angina pectoris: Secondary | ICD-10-CM | POA: Diagnosis not present

## 2018-07-01 DIAGNOSIS — W500XXA Accidental hit or strike by another person, initial encounter: Secondary | ICD-10-CM | POA: Diagnosis not present
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
## 2018-07-01 NOTE — Discharge Instructions (Signed)
Please read instructions below. You can take tylenol every 4-6 hours as needed for headache. Get plenty of sleep.  Avoid contact sports/activities until your are completely symptom free. Schedule an appointment with your primary care provider to follow up on your visit today. Return to the ER for severely worsening headache, vision changes, personality changes, or new or concerning symptoms.
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
## 2018-07-20 ENCOUNTER — Ambulatory Visit: Payer: BLUE CROSS/BLUE SHIELD

## 2018-07-20 ENCOUNTER — Other Ambulatory Visit: Payer: Self-pay | Admitting: Internal Medicine

## 2018-07-20 MED ORDER — CARVEDILOL 12.5 MG PO TABS: ORAL_TABLET | ORAL | 2 refills | Status: AC

## 2018-07-20 NOTE — Progress Notes (Deleted)
Patient ID: [name]                 DOB: 1961-02-21                      MRN: [id number]     HPI: Todd Meyer is a 58 y.o. male patient of Dr. Okey Dupre who presents today for hypertension follow up. PMH significant for non-obstructive coronary artery disease, nonischemic cardiomyopathy, hypertension, and gout. At his most recent his blood pressure was markedly elevated but no medications were made as patient states his blood pressure was well controlled at home and he was nervous as the Dr. Isidore Moos. Patient was asked to bring his home log and blood pressure meter to next visit for calibration.  Still working out? Dizziness, light headedness, blurred vision falls Headache improve since fall? Probably try to avoid chlorthalidone due to hx of gout- try amldoipine  Current HTN meds:  Losartan 100mg  daily in the morning Carvedilol 12.5mg  BID  Previously tried: none  BP goal: <130/80  Family History: Unknown as he was adopted.   Social History: He quit smoking about 7 years ago. He also previously was a drinker but now is alcohol free.   Diet: He has stopped using salts and seasonings and eats most of his meals from home. He endorses one cup of coffee per day.   Exercise: He joined a gym over the holiday and plans to start using the elliptical today.   Home BP readings: 128-130 - pre report  Wt Readings from Last 3 Encounters:  07/01/18 280 lb (127 kg)  03/13/18 284 lb (128.8 kg)  05/19/17 271 lb 12.8 oz (123.3 kg)   BP Readings from Last 3 Encounters:  07/01/18 (!) 131/96  04/23/18 (!) 152/102  03/13/18 (!) 140/98   Pulse Readings from Last 3 Encounters:  07/01/18 84  04/23/18 84  03/13/18 72    Renal function: CrCl cannot be calculated (Patient's most recent lab result is older than the maximum 21 days allowed.).  Past Medical History:  Diagnosis Date  . Chest pain   . Coronary artery disease   . Gout   . Hypertension   . Non-ischemic cardiomyopathy (HCC)       Current Outpatient Medications on File Prior to Visit  Medication Sig Dispense Refill  . aspirin EC 81 MG tablet Take 1 tablet (81 mg total) by mouth daily.    . carvedilol (COREG) 12.5 MG tablet TAKE 1 TABLET(12.5 MG) BY MOUTH TWICE DAILY 180 tablet 2  . losartan (COZAAR) 100 MG tablet Take 1 tablet (100 mg total) by mouth daily. 90 tablet 2  . MITIGARE 0.6 MG CAPS Take 0.6 mg by mouth daily. (Patient not taking: Reported on 04/23/2018) 90 capsule 1  . Multiple Vitamin (MULTIVITAMIN WITH MINERALS) TABS Take 1 tablet by mouth daily.    . rosuvastatin (CRESTOR) 5 MG tablet TAKE 1 TABLET(5 MG) BY MOUTH DAILY 90 tablet 2   No current facility-administered medications on file prior to visit.     No Known Allergies  There were no vitals taken for this visit.   Assessment/Plan: Hypertension:   Thank you,  Olene Floss, Pharm.D, BCPS [facility]  [address], [place], [place] [address]  Phone: [phone]; Fax: [phone]   07/20/2018 8:06 AM

## 2018-09-18 ENCOUNTER — Telehealth: Payer: Self-pay | Admitting: Internal Medicine

## 2018-09-18 NOTE — Telephone Encounter (Signed)
Patient had a change in insurance and will no longer be able to follow up with [facility] Recall deleted

## 2018-09-24 ENCOUNTER — Telehealth: Payer: Self-pay | Admitting: Internal Medicine

## 2018-09-24 NOTE — Telephone Encounter (Signed)
Patient called, states he will now be seeing Dr Mariea Stable at [facility] Patient requested that we get in touch with their office and send his  records Spoke with office, they will be faxing a medical records request

## 2018-11-28 IMAGING — CT CT ABD-PELV W/ CM
2 of 5 series · 17 of 46 positions shown, 19 images · IV contrast (Omni 300)
Comparison: 10/06/2007.

CLINICAL DATA: Left upper quadrant abdominal pain for the past 2
weeks, worsening.

EXAM:
CT ABDOMEN AND PELVIS WITH CONTRAST
TECHNIQUE: Multidetector CT imaging of the abdomen and pelvis was performed
using the standard protocol following bolus administration of
intravenous contrast.
CONTRAST:  100mL [id number] IOPAMIDOL ([id number]) INJECTION 61%

[Series 4: a/p w/ 5mm · axial · 0.77mm/px · z∈[+783,+1198]mm · 14 of 93 slices shown, 16 images]
[im 5/93  soft-tissue]
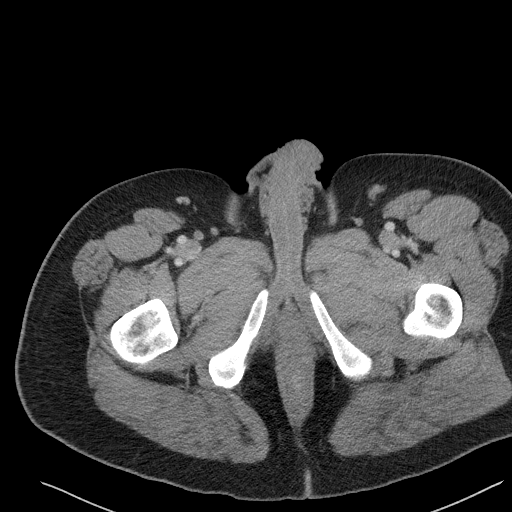
[im 5/93  bone]
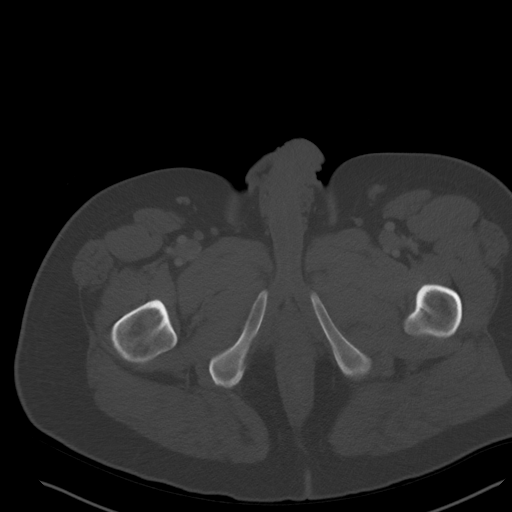
[im 14/93  soft-tissue]
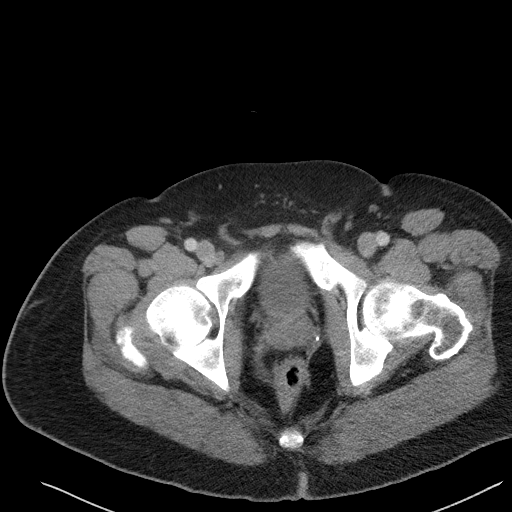
[im 19/93  soft-tissue]
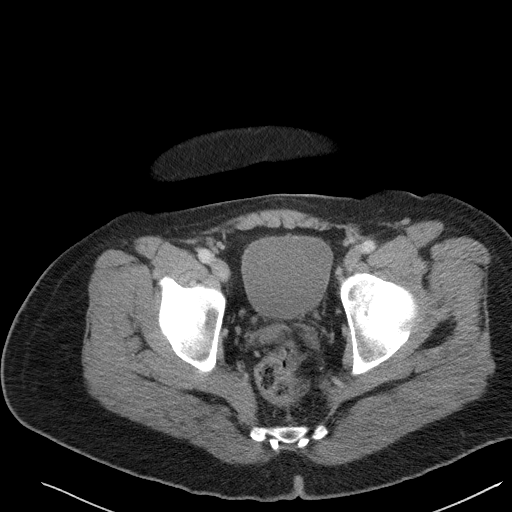
[im 24/93  soft-tissue]
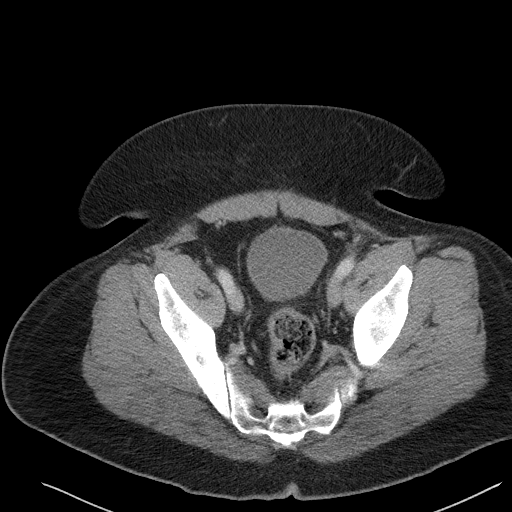
[im 33/93  soft-tissue]
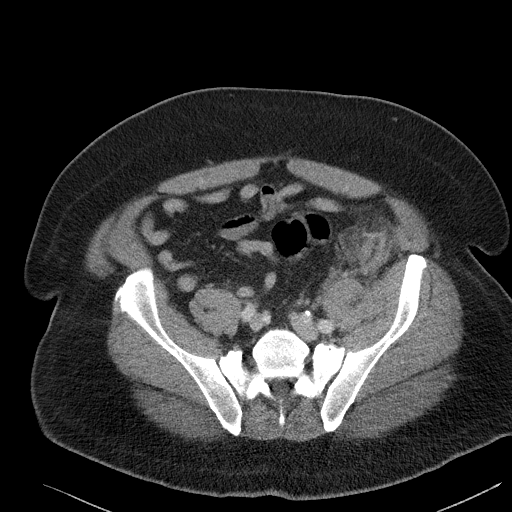
[im 37/93  soft-tissue]
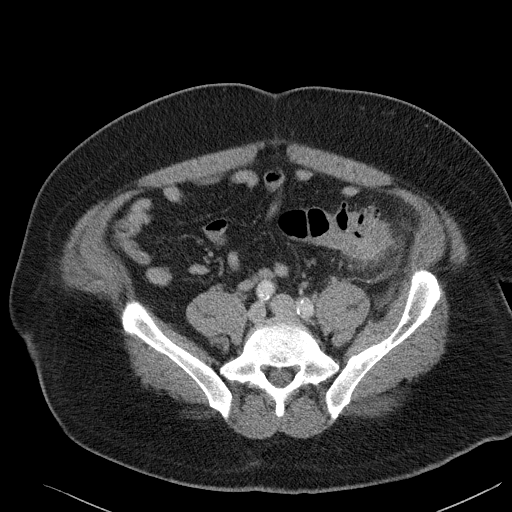
[im 42/93  soft-tissue]
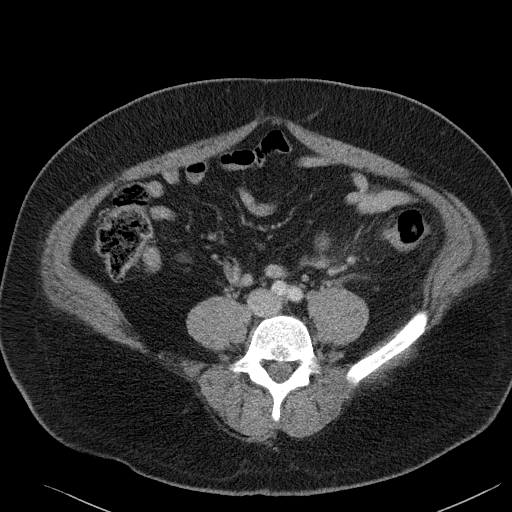
[im 51/93  soft-tissue]
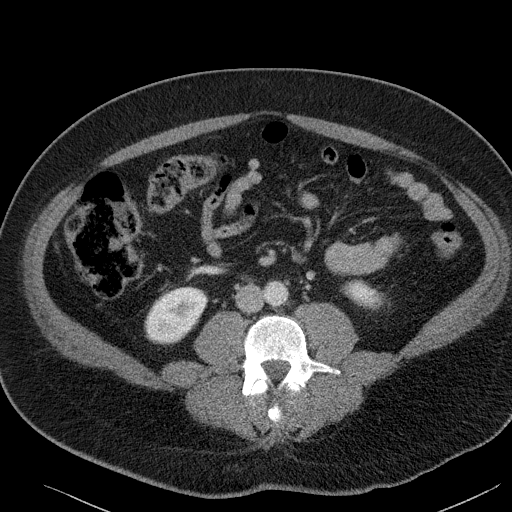
[im 56/93  soft-tissue]
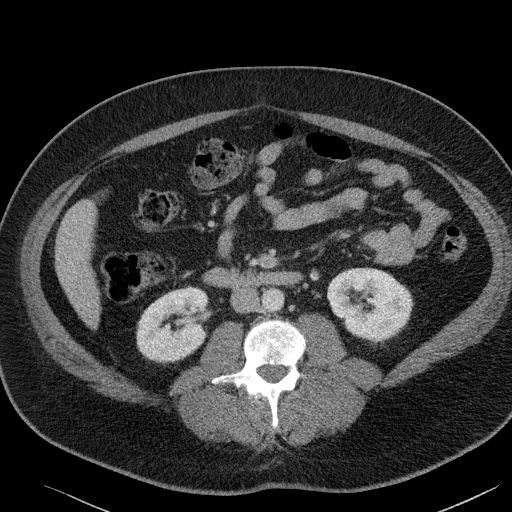
[im 56/93  bone]
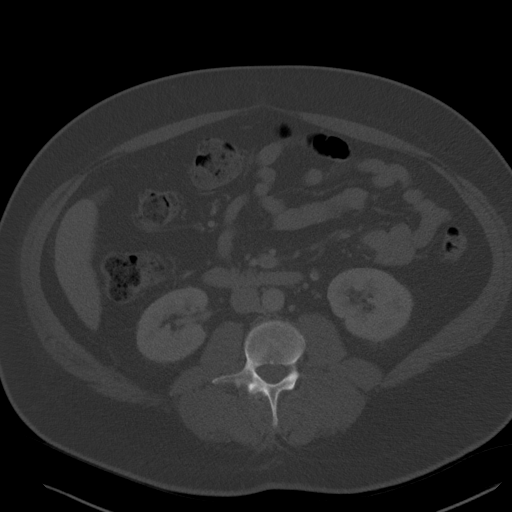
[im 60/93  soft-tissue]
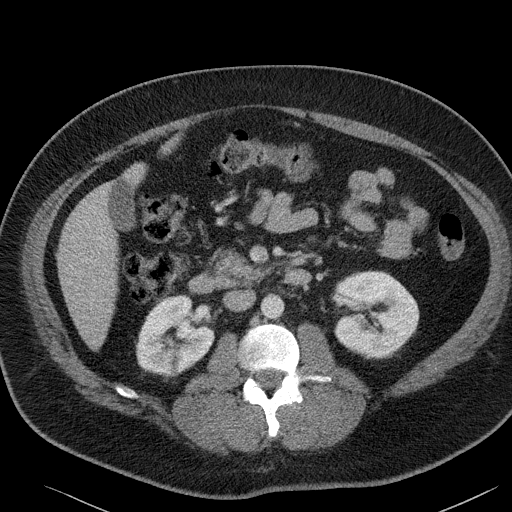
[im 70/93  soft-tissue]
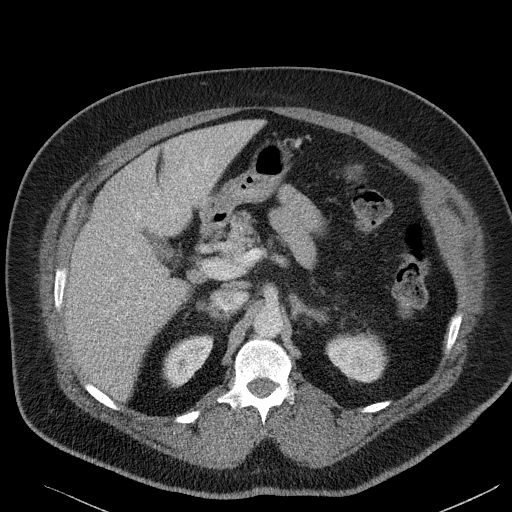
[im 74/93  soft-tissue]
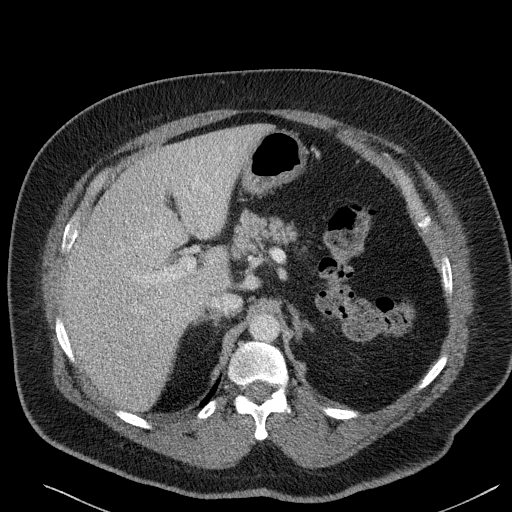
[im 79/93  soft-tissue]
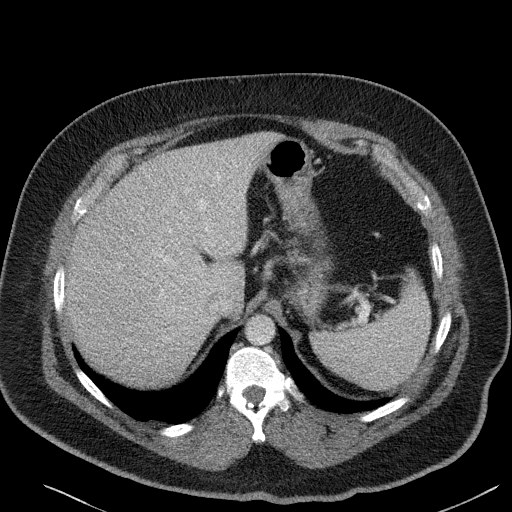
[im 88/93  soft-tissue]
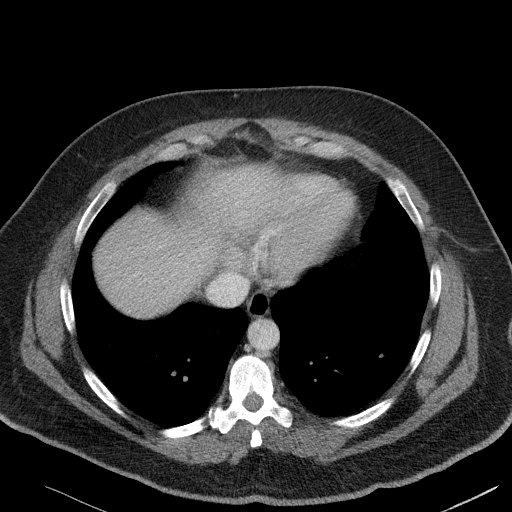

[Series 6: a/p w/ cor · coronal · 0.79mm/px · 3 of 151 slices shown]
[im 51/151  soft-tissue]
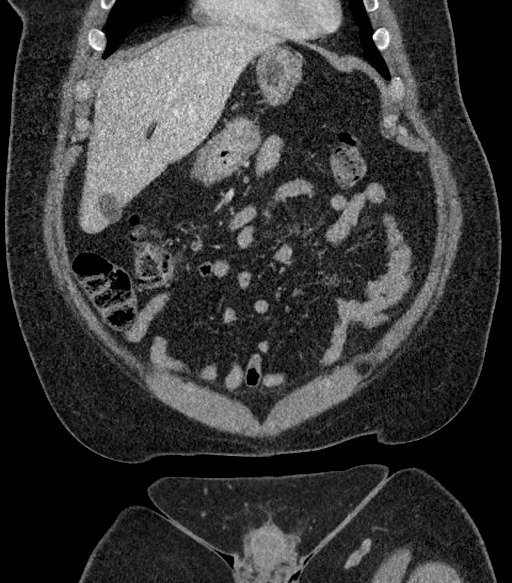
[im 67/151  soft-tissue]
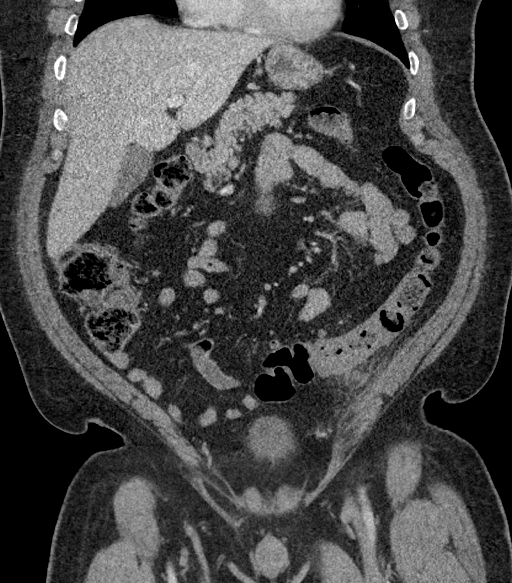
[im 84/151  soft-tissue]
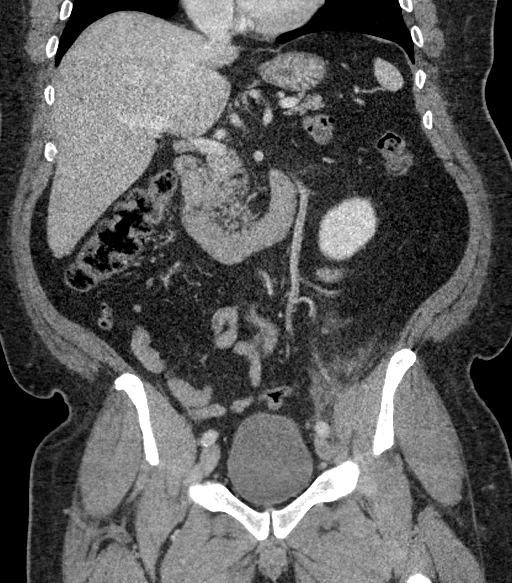

[17 of 46 positions shown; findings below may reference images not displayed]

FINDINGS: Lower chest: Unremarkable.

Hepatobiliary: No focal liver abnormality is seen. No gallstones,
gallbladder wall thickening, or biliary dilatation.

Pancreas: Unremarkable. No pancreatic ductal dilatation or
surrounding inflammatory changes.

Spleen: Normal in size without focal abnormality.

Adrenals/Urinary Tract: Adrenal glands are unremarkable. Kidneys are
normal, without renal calculi, focal lesion, or hydronephrosis.
Bladder is unremarkable.

Stomach/Bowel: Multiple proximal sigmoid colon diverticula with
adjacent pericolonic soft tissue stranding. No fluid collections or
free peritoneal air. Normal appearing appendix, stomach and small
bowel.

Vascular/Lymphatic: Atheromatous arterial calcifications without
aneurysm. No enlarged lymph nodes.

Reproductive: Prostate is unremarkable.

Other: No abdominal wall hernia or abnormality. No abdominopelvic
ascites.

Musculoskeletal: Grade 1 retrolisthesis at the L4-5 level with mild
to moderate diffuse posterior disc bulging, mild anterior and
minimal posterior spur formation.
IMPRESSION: Proximal sigmoid colon diverticulosis and diverticulitis without
abscess.

## 2019-02-23 ENCOUNTER — Other Ambulatory Visit: Payer: Self-pay | Admitting: Internal Medicine

## 2019-02-23 NOTE — Telephone Encounter (Signed)
[facility] pharmacy is requesting a refill on Mitigare 0.6 mg capsules. This is Dr. Saunders Revel pt. Dr. Saunders Revel wanted pt to f/u with him in [place]. Please address

## 2019-02-24 NOTE — Telephone Encounter (Signed)
     09/18/18 4:09 PM Note   Patient had a change in insurance and will no longer be able to follow up with [facility] Recall deleted

## 2019-02-24 NOTE — Telephone Encounter (Signed)
Please advise if ok to refill Mitigare 0.6 mg capsule not cardiac medication.
